# Patient Record
Sex: Male | Born: 1970 | Race: White | Hispanic: No | Marital: Married | Smoking: Former smoker
Health system: Southern US, Community
[De-identification: ages and names within clinical notes are randomized; demographics above are authoritative.]

## PROBLEM LIST (undated history)

## (undated) DIAGNOSIS — K219 Gastro-esophageal reflux disease without esophagitis: Secondary | ICD-10-CM

## (undated) DIAGNOSIS — M109 Gout, unspecified: Secondary | ICD-10-CM

## (undated) DIAGNOSIS — M069 Rheumatoid arthritis, unspecified: Secondary | ICD-10-CM

## (undated) DIAGNOSIS — Z8481 Family history of carrier of genetic disease: Secondary | ICD-10-CM

## (undated) DIAGNOSIS — N189 Chronic kidney disease, unspecified: Secondary | ICD-10-CM

## (undated) HISTORY — DX: Chronic kidney disease, unspecified: N18.9

## (undated) HISTORY — DX: Gout, unspecified: M10.9

## (undated) HISTORY — DX: Family history of carrier of genetic disease: Z84.81

## (undated) HISTORY — DX: Gastro-esophageal reflux disease without esophagitis: K21.9

---
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
## 2010-10-24 ENCOUNTER — Ambulatory Visit: Payer: Self-pay | Admitting: Family Medicine

## 2011-01-24 NOTE — Assessment & Plan Note (Signed)
Summary: NEW ACUTE LEFT FOOT PAIN/NJR   Vital Signs:  Patient profile:   40 year old male Height:      72 inches Weight:      226 pounds BMI:     30.76 Temp:     98.4 degrees F oral Pulse rate:   100 / minute Pulse rhythm:   regular Resp:     12 per minute BP sitting:   130 / 100  (left arm) Cuff size:   large  Vitals Entered By: Sid Falcon LPN (October 24, 2010 4:12 PM)   History of Present Illness: new patient visit.  Patient has known history of gout. One day history of acute left foot pain. No injury. Increased warmth and some mild redness. Previous flareups involving feet and elbow. Has used colchicine and prednisone with good relief in past. History of gastroesophageal reflux and takes Prilosec.  GERD  symptoms are well controlled.  Occasional alcohol use but no regular use. Smokes cigarettes. Family history unrevealing  Preventive Screening-Counseling & Management  Alcohol-Tobacco     Smoking Status: current     Packs/Day: 1.0     Year Started: 1988  Caffeine-Diet-Exercise     Does Patient Exercise: no  Allergies (verified): No Known Drug Allergies  Past History:  Social History: Last updated: 10/24/2010 Occupation:  Supervisor Married Current Smoker Alcohol use-yes Regular exercise-no  Risk Factors: Exercise: no (10/24/2010)  Risk Factors: Smoking Status: current (10/24/2010) Packs/Day: 1.0 (10/24/2010)  Past Medical History: Gout Acid reflux one kidney removed 1985  Past Surgical History: Nephrectomy PMH-FH-SH reviewed for relevance  Social History: Occupation:  Merchandiser, retail Married Current Smoker Alcohol use-yes Regular exercise-no Smoking Status:  current Packs/Day:  1.0 Occupation:  employed Does Patient Exercise:  no  Review of Systems  The patient denies fever, weight loss, abdominal pain, melena, hematochezia, and severe indigestion/heartburn.    Physical Exam  General:  Well-developed,well-nourished,in no acute  distress; alert,appropriate and cooperative throughout examination Mouth:  Oral mucosa and oropharynx without lesions or exudates.  Teeth in good repair. Neck:  No deformities, masses, or tenderness noted. Lungs:  Normal respiratory effort, chest expands symmetrically. Lungs are clear to auscultation, no crackles or wheezes. Heart:  Normal rate and regular rhythm. S1 and S2 normal without gallop, murmur, click, rub or other extra sounds. Extremities:  left foot reveals mild diffuse edema. He has some diffuse tenderness to palpation mostly around the metatarsophalangeal joint. Skin:  no rashes and no suspicious lesions.     Impression & Recommendations:  Problem # 1:  GOUT (ICD-274.9) Assessment Deteriorated pred taper and Colcrys one two times a day.  Discussed diet triggers.  He only has 1-2 episodes/yr and not interested in prophylaxis at this time. His updated medication list for this problem includes:    Colcrys 0.6 Mg Tabs (Colchicine) ..... One by mouth two times a day  Complete Medication List: 1)  Prilosec Otc 20 Mg Tbec (Omeprazole magnesium) .... Once daily 2)  Colcrys 0.6 Mg Tabs (Colchicine) .... One by mouth two times a day 3)  Prednisone 10 Mg Tabs (Prednisone) .... Taper as follows:  6-5-4-4-3-3-2-1 4)  Oxycodone Hcl 5 Mg Tabs (Oxycodone hcl) .Marland Kitchen.. 1-2 by mouth q 6 hours as needed pain  Patient Instructions: 1)  Touch base in 2-3 days if no significant improvement Prescriptions: OXYCODONE HCL 5 MG TABS (OXYCODONE HCL) 1-2 by mouth q 6 hours as needed pain  #15 x 0   Entered and Authorized by:   Evelena Peat MD  Signed by:   Evelena Peat MD on 10/24/2010   Method used:   Print then Give to Patient   RxID:   [id number] PREDNISONE 10 MG TABS (PREDNISONE) taper as follows:  6-5-4-4-3-3-2-1  #28 x 0   Entered and Authorized by:   Evelena Peat MD   Signed by:   Evelena Peat MD on 10/24/2010   Method used:   Print then Give to Patient   RxID:    [id number] COLCRYS 0.6 MG TABS (COLCHICINE) one by mouth two times a day  #60 x 5   Entered and Authorized by:   Evelena Peat MD   Signed by:   Evelena Peat MD on 10/24/2010   Method used:   Print then Give to Patient   RxID:   [id number]    Orders Added: 1)  New Patient Level II [01027]

## 2011-02-10 ENCOUNTER — Other Ambulatory Visit: Payer: Self-pay | Admitting: *Deleted

## 2011-02-10 NOTE — Telephone Encounter (Signed)
May refill prednisone for gout flare-as per prior taper.

## 2011-02-10 NOTE — Telephone Encounter (Signed)
Rx called in, documented in [facility] as pt has not been seen yet per Dr Caryl Never

## 2011-02-10 NOTE — Telephone Encounter (Signed)
Wants Oxycodone refill for gout

## 2011-03-21 ENCOUNTER — Other Ambulatory Visit: Payer: Self-pay | Admitting: Family Medicine

## 2011-03-23 ENCOUNTER — Other Ambulatory Visit: Payer: Self-pay | Admitting: *Deleted

## 2011-03-23 ENCOUNTER — Telehealth: Payer: Self-pay | Admitting: *Deleted

## 2011-03-23 MED ORDER — PREDNISONE 10 MG PO TABS: 10.0000 mg | ORAL_TABLET | Freq: Every day | ORAL | Status: DC

## 2011-03-23 NOTE — Telephone Encounter (Signed)
Corrected number on tabs called to pharmacy

## 2014-11-13 ENCOUNTER — Ambulatory Visit (INDEPENDENT_AMBULATORY_CARE_PROVIDER_SITE_OTHER): Payer: BC Managed Care – PPO | Admitting: Emergency Medicine

## 2014-11-13 VITALS — BP 124/78 | HR 85 | Temp 97.3°F | Resp 18 | Ht 73.0 in | Wt 227.8 lb

## 2014-11-13 DIAGNOSIS — M109 Gout, unspecified: Secondary | ICD-10-CM

## 2014-11-13 DIAGNOSIS — M10061 Idiopathic gout, right knee: Secondary | ICD-10-CM

## 2014-11-13 DIAGNOSIS — M25561 Pain in right knee: Secondary | ICD-10-CM

## 2014-11-13 LAB — POCT CBC
Granulocyte percent: 87.2 %G — AB (ref 37–80)
HCT, POC: 46.9 % (ref 43.5–53.7)
Hemoglobin: 15.5 g/dL (ref 14.1–18.1)
Lymph, poc: 0.9 (ref 0.6–3.4)
MCH, POC: 30.4 pg (ref 27–31.2)
MCHC: 33 g/dL (ref 31.8–35.4)
MCV: 92.2 fL (ref 80–97)
MID (cbc): 0.4 (ref 0–0.9)
MPV: 7.9 fL (ref 0–99.8)
POC Granulocyte: 8.9 — AB (ref 2–6.9)
POC LYMPH PERCENT: 9 %L — AB (ref 10–50)
POC MID %: 3.8 %M (ref 0–12)
Platelet Count, POC: 214 10*3/uL (ref 142–424)
RBC: 5.09 M/uL (ref 4.69–6.13)
RDW, POC: 13.3 %
WBC: 10.2 10*3/uL (ref 4.6–10.2)

## 2014-11-13 LAB — POCT SEDIMENTATION RATE: POCT SED RATE: 16 mm/hr (ref 0–22)

## 2014-11-13 MED ORDER — TRIAMCINOLONE ACETONIDE 40 MG/ML IJ SUSP
40.0000 mg | Freq: Once | INTRAMUSCULAR | Status: AC
  Administered 2014-11-13: 40 mg via INTRAMUSCULAR

## 2014-11-13 MED ORDER — HYDROCODONE-ACETAMINOPHEN 5-325 MG PO TABS: 1.0000 | ORAL_TABLET | ORAL | Status: DC | PRN

## 2014-11-13 MED ORDER — INDOMETHACIN 25 MG PO CAPS: 25.0000 mg | ORAL_CAPSULE | Freq: Three times a day (TID) | ORAL | Status: DC

## 2014-11-13 NOTE — Patient Instructions (Signed)

## 2014-11-13 NOTE — Progress Notes (Signed)
[facility] [address], [place] [address] [phone]  Date:  11/13/2014   Name:  Elijah King   DOB:  09/15/1971   MRN:  [id number]  PCP:  No PCP Per Patient    Chief Complaint: Gout   History of Present Illness:  Elijah King is a 43 y.o. very pleasant male patient who presents with the following:  History of gout.  Seen by [facility] Tuesday and put on colcrys and prednisone Has no improvement with treatment.   No history of injury or overuse No antecedent illness.  No fever or chills. Unable to bear weight on right leg. No improvement with over the counter medications or other home remedies.  Denies other complaint or health concern today.   There are no active problems to display for this patient.   Past Medical History  Diagnosis Date  . Chronic kidney disease     Past Surgical History  Procedure Laterality Date  . Kidney cyst removal      History  Substance Use Topics  . Smoking status: Current Every Day Smoker  . Smokeless tobacco: Not on file  . Alcohol Use: 0.0 oz/week    0 Not specified per week    History reviewed. No pertinent family history.  No Known Allergies  Medication list has been reviewed and updated.  No current outpatient prescriptions on file prior to visit.   No current facility-administered medications on file prior to visit.    Review of Systems:   As per HPI, otherwise negative.    Physical Examination: Filed Vitals:   11/13/14 1153  BP: 124/78  Pulse: 85  Temp: 97.3 F (36.3 C)  Resp: 18   Filed Vitals:   11/13/14 1153  Height: 6\' 1"  (1.854 m)  Weight: 227 lb 12.8 oz (103.329 kg)   Body mass index is 30.06 kg/(m^2). Ideal Body Weight: Weight in (lb) to have BMI = 25: 189.1   GEN: WDWN, NAD, Non-toxic, Alert & Oriented x 3 HEENT: Atraumatic, Normocephalic.  Ears and Nose: No external deformity. EXTR: No clubbing/cyanosis/edema NEURO: Normal gait.  PSYCH: Normally interactive.  Conversant. Not depressed or anxious appearing.  Calm demeanor.  RIGHT knee:  Warm and tender.  Guards, swollen  Assessment and Plan: Gout right knee Kenalog 40 Aspiration Labs pending  Signed,  Ellison Carwin, MD   Results for orders placed or performed in visit on 11/13/14  POCT CBC  Result Value Ref Range   WBC 10.2 4.6 - 10.2 K/uL   Lymph, poc 0.9 0.6 - 3.4   POC LYMPH PERCENT 9.0 (A) 10 - 50 %L   MID (cbc) 0.4 0 - 0.9   POC MID % 3.8 0 - 12 %M   POC Granulocyte 8.9 (A) 2 - 6.9   Granulocyte percent 87.2 (A) 37 - 80 %G   RBC 5.09 4.69 - 6.13 M/uL   Hemoglobin 15.5 14.1 - 18.1 g/dL   HCT, POC 46.9 43.5 - 53.7 %   MCV 92.2 80 - 97 fL   MCH, POC 30.4 27 - 31.2 pg   MCHC 33.0 31.8 - 35.4 g/dL   RDW, POC 13.3 %   Platelet Count, POC 214 142 - 424 K/uL   MPV 7.9 0 - 99.8 fL

## 2014-11-14 LAB — URIC ACID: Uric Acid, Serum: 8.8 mg/dL — ABNORMAL HIGH (ref 4.0–7.8)

## 2014-11-16 ENCOUNTER — Ambulatory Visit (INDEPENDENT_AMBULATORY_CARE_PROVIDER_SITE_OTHER): Payer: BC Managed Care – PPO | Admitting: Internal Medicine

## 2014-11-16 ENCOUNTER — Ambulatory Visit (INDEPENDENT_AMBULATORY_CARE_PROVIDER_SITE_OTHER): Payer: BC Managed Care – PPO

## 2014-11-16 VITALS — BP 142/82 | HR 101 | Temp 98.7°F | Resp 16 | Ht 73.0 in | Wt 227.0 lb

## 2014-11-16 DIAGNOSIS — M25561 Pain in right knee: Secondary | ICD-10-CM

## 2014-11-16 MED ORDER — PREDNISONE 20 MG PO TABS: ORAL_TABLET | ORAL | Status: DC

## 2014-11-16 MED ORDER — OXYCODONE-ACETAMINOPHEN 10-325 MG PO TABS: 1.0000 | ORAL_TABLET | Freq: Four times a day (QID) | ORAL | Status: DC | PRN

## 2014-11-16 NOTE — Progress Notes (Signed)
   Subjective:  This chart was scribed for [employer]. Laney Pastor, MD by Terressa Koyanagi, ED Scribe. This patient was seen in room 2 and the patient's care was started at 8:43 PM.     Patient ID: Elijah King, male    DOB: October 11, 1971, 43 y.o.   MRN: [id number]  Chief Complaint  Patient presents with  . Follow-up    Knee pain-not any better-Uric Acid was elevated    HPI PCP: No PCP Per Patient HPI Comments: Elijah King is a 43 y.o. male, with a Hx of gout, who presents to the [facility] complaining of acute, atraumatic, severe, ongoing, right knee pain. Pt reports that he has never experienced knee pain resulting from gout;  he generally gets pain in his toes due to gout. Started colchicine 1 per day and pred taper from PCP per phone 11/18. Then OV here Dr Ouida Sills 11/20 with dry tap but instillation kenalog and addition Indocin 25 tid. Pt reports that he has been taking everything as directed with little or no response. It is painful to ambulate and  "even the drive to the clinic today was difficult."   Numbness to Right Thigh: Pt also complains of a tingling, numbness and pins and needles sensation to his right thigh. Pt reports he works in [employer] and at [employer] most of the day. Intermittent 3 mos.  Denials: Pt denies fever, night sweats.    Past Medical History  Diagnosis Date  . Chronic kidney disease    Past Surgical History  Procedure Laterality Date  . Kidney cyst removal     Current Outpatient Prescriptions on File Prior to Visit  Medication Sig Dispense Refill  . Colchicine (COLCRYS PO) Take by mouth.    Marland Kitchen HYDROcodone-acetaminophen (NORCO) 5-325 MG per tablet Take 1-2 tablets by mouth every 4 (four) hours as needed. 30 tablet 0  . indomethacin (INDOCIN) 25 MG capsule Take 1 capsule (25 mg total) by mouth 3 (three) times daily with meals. 45 capsule 1   No current facility-administered medications on file prior to visit.     Review of  Systems  Constitutional: Negative for fever, chills and diaphoresis.  Musculoskeletal:       Right knee pain  Psychiatric/Behavioral: Negative for confusion.       Objective:   Physical Exam  Constitutional: He appears well-developed and well-nourished.  Musculoskeletal: He exhibits tenderness.  Right knee is puffy but not red and not very tender to palpation. Exquisitely tender with any ROM. No laxity.  No effusion. Maximal tenderness infrapatellar to light touch.  Skin: No rash noted.     [facility] reading (PRIMARY) by  Dr. Ramadan Couey=NAD.   Assessment & Plan:  Knee pain due to acute gouty arthrirtis  Restart pred at 80-0 over 8 days  incr colchicine to bid til resolved  Set up ortho consult  Chg to percocet for pain  In view of elevated uric acid with hx of some type of CKD he will need to f/u w/ his PCP to get on a strict diet with allopurinol    I personally performed the services described in this documentation, which was scribed in my presence. The recorded information has been reviewed and is accurate.

## 2014-11-16 NOTE — Patient Instructions (Addendum)
take colchicine twice a day til well then once a day for at least 1 month Take the new prednisone taper

## 2014-11-30 NOTE — Progress Notes (Signed)
Tried to call patient to see if anyone had scheduled his appt with Dr. Rip Harbour but his answering machine was full.

## 2015-03-04 ENCOUNTER — Ambulatory Visit (INDEPENDENT_AMBULATORY_CARE_PROVIDER_SITE_OTHER): Payer: 59 | Admitting: Family Medicine

## 2015-03-04 ENCOUNTER — Telehealth: Payer: Self-pay

## 2015-03-04 VITALS — BP 140/92 | HR 85 | Temp 99.0°F | Resp 18 | Ht 72.25 in | Wt 226.0 lb

## 2015-03-04 DIAGNOSIS — E669 Obesity, unspecified: Secondary | ICD-10-CM

## 2015-03-04 DIAGNOSIS — Z79899 Other long term (current) drug therapy: Secondary | ICD-10-CM | POA: Diagnosis not present

## 2015-03-04 DIAGNOSIS — IMO0001 Reserved for inherently not codable concepts without codable children: Secondary | ICD-10-CM

## 2015-03-04 DIAGNOSIS — M1009 Idiopathic gout, multiple sites: Secondary | ICD-10-CM

## 2015-03-04 DIAGNOSIS — R030 Elevated blood-pressure reading, without diagnosis of hypertension: Secondary | ICD-10-CM

## 2015-03-04 LAB — COMPREHENSIVE METABOLIC PANEL
ALT: 41 U/L (ref 0–53)
AST: 32 U/L (ref 0–37)
Albumin: 4.4 g/dL (ref 3.5–5.2)
Alkaline Phosphatase: 79 U/L (ref 39–117)
BUN: 17 mg/dL (ref 6–23)
CO2: 27 mEq/L (ref 19–32)
Calcium: 9.3 mg/dL (ref 8.4–10.5)
Chloride: 102 mEq/L (ref 96–112)
Creat: 1.39 mg/dL — ABNORMAL HIGH (ref 0.50–1.35)
Glucose, Bld: 112 mg/dL — ABNORMAL HIGH (ref 70–99)
Potassium: 5.2 mEq/L (ref 3.5–5.3)
Sodium: 137 mEq/L (ref 135–145)
Total Bilirubin: 0.3 mg/dL (ref 0.2–1.2)
Total Protein: 7.2 g/dL (ref 6.0–8.3)

## 2015-03-04 LAB — LIPID PANEL
Cholesterol: 186 mg/dL (ref 0–200)
HDL: 46 mg/dL (ref >=40)
LDL Cholesterol: 109 mg/dL — ABNORMAL HIGH (ref 0–99)
Total CHOL/HDL Ratio: 4 Ratio
Triglycerides: 156 mg/dL — ABNORMAL HIGH (ref <150)
VLDL: 31 mg/dL (ref 0–40)

## 2015-03-04 MED ORDER — PREDNISONE 20 MG PO TABS: ORAL_TABLET | ORAL | Status: DC

## 2015-03-04 MED ORDER — METHYLPREDNISOLONE SODIUM SUCC 125 MG IJ SOLR
125.0000 mg | Freq: Once | INTRAMUSCULAR | Status: AC
  Administered 2015-03-04: 125 mg via INTRAMUSCULAR

## 2015-03-04 MED ORDER — INDOMETHACIN 50 MG PO CAPS: 50.0000 mg | ORAL_CAPSULE | Freq: Three times a day (TID) | ORAL | Status: DC

## 2015-03-04 MED ORDER — OXYCODONE-ACETAMINOPHEN 10-325 MG PO TABS: 1.0000 | ORAL_TABLET | Freq: Four times a day (QID) | ORAL | Status: DC | PRN

## 2015-03-04 MED ORDER — COLCHICINE 0.6 MG PO TABS: ORAL_TABLET | ORAL | Status: DC

## 2015-03-04 MED ORDER — ALLOPURINOL 300 MG PO TABS: 300.0000 mg | ORAL_TABLET | Freq: Every day | ORAL | Status: DC

## 2015-03-04 NOTE — Telephone Encounter (Signed)
PA needed for colchicine. I called pharmacy to see if they could run Rx for both brand names: Colcrys tablets and Mitigare capsules. They said that none of the 3 are going through and each one that rejects says to try one of the others, which then rejects. Completed PA on covermymeds. Pending.

## 2015-03-04 NOTE — Progress Notes (Signed)
This chart was scribed for Delman Cheadle, MD by Einar Pheasant, ED Scribe. This patient was seen in room 2 and the patient's care was started at 8:20 AM.  Subjective:    Patient ID: Elijah King, male    DOB: [birth year], 44 y.o.   MRN: [id number]  Chief Complaint  Patient presents with  . Gout    pt has history of gout - not on any meds - pain 8/10  . Elbow Pain    Right elbow pain x 1 day  . Foot Pain    Right foot pain x 1 day    HPI Elijah King is a 44 y.o. male with PMhx of gout for 8-9 yrs presents to the office complaining of an exacerbation of his gout. Last November he presented for the same chief complaint with an elevated uric acid months prior during gout flare in right knee he has been treated with prednisone and colcry but with last flare he required and intraarticular Cortizone injection without significant benefit at his 3 day follow up. He was also place on Indocin and then second higher of parenteral Cortizone injection along with ortho referral, plus percocet. Looks like pt saw dr. Conan Bowens at [facility].   Today, pt comes in complaining of right elbow pain and right foot that have been going on for 1 day now. He states that he has never been placed on long term medication. Pt states that when he went to his ortho referral the provider just did some test but not alleviating treatments. He states that in the past the intraarticular Cortizone shot did not provide him adequate relief. Pt is out of the colcry. He states that he tried to make the necessary dietary changes but it hasn't helped much.   There are no active problems to display for this patient.  Past Medical History  Diagnosis Date  . Chronic kidney disease    Past Surgical History  Procedure Laterality Date  . Kidney cyst removal     No Known Allergies Prior to Admission medications   Medication Sig Start Date End Date Taking? Authorizing Provider  COLCRYS 0.6 MG tablet  11/11/14   Historical  Provider, MD  indomethacin (INDOCIN) 25 MG capsule Take 1 capsule (25 mg total) by mouth 3 (three) times daily with meals. Patient not taking: Reported on 03/04/2015 11/13/14   Roselee Culver, MD  metroNIDAZOLE (FLAGYL) 500 MG tablet  11/10/14   Historical Provider, MD  oxyCODONE-acetaminophen (PERCOCET) 10-325 MG per tablet Take 1 tablet by mouth every 6 (six) hours as needed for pain. Patient not taking: Reported on 03/04/2015 11/16/14   Leandrew Koyanagi, MD  predniSONE (DELTASONE) 20 MG tablet 4/4/3/3/2/2/1/1 single daily dose for 8 days Patient not taking: Reported on 03/04/2015 11/16/14   Leandrew Koyanagi, MD   History   Social History  . Marital Status: Married    Spouse Name: N/A  . Number of Children: N/A  . Years of Education: N/A   Occupational History  . Not on file.   Social History Main Topics  . Smoking status: Current Every Day Smoker -- 0.25 packs/day    Types: Cigarettes    Start date: 03/03/1989  . Smokeless tobacco: Never Used  . Alcohol Use: 0.0 oz/week    0 Standard drinks or equivalent per week  . Drug Use: No  . Sexual Activity: Not on file   Other Topics Concern  . Not on file   Social History Narrative  Review of Systems  Constitutional: Negative for fever and chills.  HENT: Negative for congestion and sore throat.   Eyes: Negative for visual disturbance.  Respiratory: Negative for cough and shortness of breath.   Cardiovascular: Negative for chest pain and leg swelling.  Gastrointestinal: Negative for nausea, vomiting, abdominal pain and diarrhea.  Genitourinary: Negative for dysuria.  Musculoskeletal: Positive for joint swelling and arthralgias. Negative for back pain and neck pain.  Skin: Negative for rash.  Neurological: Negative for headaches.  Hematological: Does not bruise/bleed easily.  Psychiatric/Behavioral: Negative for confusion.     BP 140/92 mmHg  Pulse 85  Temp(Src) 99 F (37.2 C) (Oral)  Resp 18  Ht 6' 0.25" (1.835  m)  Wt 226 lb (102.513 kg)  BMI 30.44 kg/m2  SpO2 98%  Objective:   Physical Exam  Constitutional: He is oriented to person, place, and time. He appears well-developed and well-nourished. No distress.  HENT:  Head: Normocephalic and atraumatic.  Eyes: Conjunctivae and EOM are normal.  Neck: Neck supple.  Cardiovascular: Normal rate.   Pulses:      Dorsalis pedis pulses are 2+ on the left side.  Pulmonary/Chest: Effort normal. No respiratory distress.  Musculoskeletal: Normal range of motion. He exhibits edema and tenderness.  Right Foot: Mild erytema and warm especially over distal aspect of right foot. Minimal active ROM secondary to pain.  2+ pedal edema. Blanching rash. Pain mainly localized over MTP.  Right Elbow: very mild warmth of the distal olecranon with 1+ effusion of the olecranon bursa. Minimal restriction in ROM secondary to pain and swelling  Neurological: He is alert and oriented to person, place, and time.  Skin: Skin is warm and dry.  Psychiatric: He has a normal mood and affect. His behavior is normal.  Nursing note and vitals reviewed.  Assessment & Plan:   8:36 AM- Advised pt to see a rheumatologist in the near future and to also wear comfortable shoes when the flare resolves. Recommended that pt return in 3-4 weeks to recheck his uric acid level to ensure allopurinol dose is effective.  Acute idiopathic gout of multiple sites - Plan: Comprehensive metabolic panel, methylPREDNISolone sodium succinate (SOLU-MEDROL) 125 mg/2 mL injection 125 mg - cannot afford colcrys so start prednisone taper then transition to indocin - advised pt to keep indocin at home and restart ticdd immediately when gout flairs to hopefully abort.  Start allopurinol for flair prevention due to freq severe attacks recently.  Reviewed recommended diet changes.  Polypharmacy - Plan: methylPREDNISolone sodium succinate (SOLU-MEDROL) 125 mg/2 mL injection 125 mg - No renal function labs seen in  pt's chart so will obtain them today before starting pt on medication. Pt agrees to the proposed course of treatment.   Elevated blood pressure - Plan: Lipid panel  Obesity, Class I, BMI 30.0-34.9 (see actual BMI) - Plan: Lipid panel  Meds ordered this encounter  Medications  . methylPREDNISolone sodium succinate (SOLU-MEDROL) 125 mg/2 mL injection 125 mg    Sig:   . predniSONE (DELTASONE) 20 MG tablet    Sig: 4/4/3/3/2/2/1/1 single daily dose for 8 days    Dispense:  20 tablet    Refill:  0  . oxyCODONE-acetaminophen (PERCOCET) 10-325 MG per tablet    Sig: Take 1 tablet by mouth every 6 (six) hours as needed for pain.    Dispense:  30 tablet    Refill:  0  . indomethacin (INDOCIN) 50 MG capsule    Sig: Take 1 capsule (50 mg total)  by mouth 3 (three) times daily with meals.    Dispense:  60 capsule    Refill:  1  . colchicine (COLCRYS) 0.6 MG tablet    Sig: Take 2 tabs po x 1 then 1 tab 1 hour later. Take immed at onset of gout flair. Repeat daily until diarrhea or flair resolving    Dispense:  30 tablet    Refill:  1  . allopurinol (ZYLOPRIM) 300 MG tablet    Sig: Take 1 tablet (300 mg total) by mouth daily.    Dispense:  90 tablet    Refill:  0    I personally performed the services described in this documentation, which was scribed in my presence. The recorded information has been reviewed and considered, and addended by me as needed.  Delman Cheadle, MD MPH

## 2015-03-04 NOTE — Patient Instructions (Signed)
Come back in 4 weeks so we can check your labs and make sure your medication is at the right dose for gout prevention.  Start the oral prednisone tomorrow morning so it doesn't keep you up today. Do not take the indocin while you are on the prednisone - ok to overlap maybe at the very lowest dose of prednisone but will likely cause more stomach upset without significant reduction in pain. Take the indocin and prednisone with food. Start the colcrys today with as needed oxycodone.   You can reduce your uric acid level by avoiding red meat, saturated fats (dairy fats, butter fats, animal fats), high fructose corn syrup, and beer and drinking more water.  There are some easy diet adjustments that you can make to lower your blood uric acid level and thereby hopefully never have to suffer from another gout flair (or at least less frequently).  You should avoid alcohol, drink plenty of water, and try to follow a "low purine" diet as your body produces uric acid when it breaks down prurines-substances that are found naturally in your body, as well as in certain foods such as organ meats, anchioves, herring, asparagus, and mushrooms. Also, increasing your diet in certain foods that may lower uric acid levels is a pretty safe way to decrease your likelihood of gout so consider drinking coffee (regular and/or decaf), eating fruits with Vitamin C in them such as citrus fruits, strawberries, broccoli,  brussel sprouts, papaya, and cantaloupe (though megadoses of vitamin C supplements may do the opposite and increase your body's uric acid levels), and/or eating more cherries and other dark-colored fruits, such as blackberries, blueberries, purple grapes and raspberries. In addition, getting plenty of vitamin A though yellow fruits, or dark green/yellow vegetables at least every other day is good.  Other general diet guidelines for people with gout who need to lower their blood uric acid levels are as follows:    Drink 8 to 16 cups ( about 2 to 4 liters) of fluid each day, with at least half being water. Eat a moderate amount of protein, preferably from healthy sources, such as low-fat or fat-free dairy, tofu, eggs, and nut butters. Limit your daily intake of meat, fish, and poultry to 4 to 6 ounces. Avoid high fat meats and desserts. Decrease your intake of shellfish, beef, lamb, pork, eggs and cheese. Avoid drastic weight reduction or fasting.  If weight loss is desired lose it over a period of several months.   Information for patients with Gout  Gout defined-Gout occurs when urate crystals accumulate in your joint causing the inflammation and intense pain of gout attack.  Urate crystals can form when you have high levels of uric acid in your blood.  Your body produces uric acid when it breaks down prurines-substances that are found naturally in your body, as well as in certain foods such as organ meats, anchioves, herring, asparagus, and mushrooms.  Normally uric acid dissolves in your blood and passes through your kidneys into your urine.  But sometimes your body either produces too much uric acid or your kidneys excrete too little uric acid.  When this happens, uric acid can build up, forming sharp needle-like urate crystals in a joint or surrounding tissue that cause pain, inflammation and swelling.    Gout is characterized by sudden, severe attacks of pain, redness and tenderness in joints, often the joint at the base of the big toe.  Gout is complex form of arthritis that can affect anyone.  Men are more likely to get gout but women become increasingly more susceptible to gout after menopause.  An acute attack of gout can wake you up in the middle of the night with the sensation that your big toe is on fire.  The affected joint is hot, swollen and so tender that even the weight or the sheet on it may seem intolerable.  If you experience symptoms of an acute gout attack it is important to your  doctor as soon as the symptoms start.  Gout that goes untreated can lead to worsening pain and joint damage.  Risk Factors:  You are more likely to develop gout if you have high levels of uric acid in your body.    Factors that increase the uric acid level in your body include:  Lifestyle factors.  Excessive alcohol use-generally more than two drinks a day for men and more than one for women increase the risk of gout.  Medical conditions.  Certain conditions make it more likely that you will develop gout.  These include hypertension, and chronic conditions such as diabetes, high levels of fat and cholesterol in the blood, and narrowing of the arteries.  Certain medications.  The uses of Thiazide diuretics- commonly used to treat hypertension and low dose aspirin can also increase uric acid levels.  Family history of gout.  If other members of your family have had gout, you are more likely to develop the disease.  Age and sex. Gout occurs more often in men than it does in women, primarily because women tend to have lower uric acid levels than men do.  Men are more likely to develop gout earlier usually between the ages of 23-50- whereas women generally develop signs and symptoms after menopause.    Tests and diagnosis:  Tests to help diagnose gout may include:  Blood test.  Your doctor may recommend a blood test to measure the uric acid level in your blood .  Blood tests can be misleading, though.  Some people have high uric acid levels but never experience gout.  And some people have signs and symptoms of gout, but don't have unusual levels of uric acid in their blood.  Joint fluid test.  Your doctor may use a needle to draw fluid from your affected joint.  When examined under the microscope, your joint fluid may reveal urate crystals.  Treatment:  Treatment for gout usually involves medications.  What medications you and your doctor choose will be based on your current health and other  medications you currently take.  Gout medications can be used to treat acute gout attacks and prevent future attacks as well as reduce your risk of complications from gout such as the development of tophi from urate crystal deposits.  Alternative medicine:   Certain foods have been studied for their potential to lower uric acid levels, including:  Coffee.  Studies have found an association between coffee drinking (regular and decaf) and lower uric acid levels.  The evidence is not enough to encourage non-coffee drinkers to start, but it may give clues to new ways of treating gout in the future.  Vitamin C.  Supplements containing vitamin C may reduce the levels of uric acid in your blood.  However, vitamin as a treatment for gout. Don't assume that if a little vitamin C is good, than lots is better.  Megadoses of vitamin C may increase your bodies uric acid levels.  Cherries.  Cherries have been associated with lower levels of  uric acid in studies, but it isn't clear if they have any effect on gout signs and symptoms.  Eating more cherries and other dar-colored fruits, such as blackberries, blueberries, purple grapes and raspberries, may be a safe way to support your gout treatment.    Lifestyle/Diet Recommendations:   Drink 8 to 16 cups ( about 2 to 4 liters) of fluid each day, with at least half being water.  Avoid alcohol  Eat a moderate amount of protein, preferably from healthy sources, such as low-fat or fat-free dairy, tofu, eggs, and nut butters.  Limit you daily intake of meat, fish, and poultry to 4 to 6 ounces.  Avoid high fat meats and desserts.  Decrease you intake of shellfish, beef, lamb, pork, eggs and cheese.  Choose a good source of vitamin C daily such as citrus fruits, strawberries, broccoli,  brussel sprouts, papaya, and cantaloupe.   Choose a good source of vitamin A every other day such as yellow fruits, or dark green/yellow vegetables.  Avoid drastic weigh  reduction or fasting.  If weigh loss is desired lose it over a period of several months.  See "dietary considerations.." chart for specific food recommendations.  Dietary Considerations for people with Gout  Food with negligible purine content (0-15 mg of purine nitrogen per 100 grams food)  May use as desired except on calorie variations  Non fat milk Cocoa Cereals (except in list II) Hard candies  Buttermilk Carbonated drinks Vegetables (except in list II) Sherbet  Coffee Fruits Sugar Honey  Tea Cottage Cheese Gelatin-jell-o Salt  Fruit juice Breads Angel food Cake   Herbs/spices Jams/Jellies [employer]    Foods that do not contain excessive purine content, but must be limited due to fat content  Cream Eggs Oil and Salad Dressing  Half and Half Peanut Butter Chocolate  Whole Milk Cakes Potato Chips  Butter Ice Cream Fried Foods  Cheese Nuts Waffles, pancakes   List II: Food with moderate purine content (50-150 mg of purine nitrogen per 100 grams of food)  Limit total amount each day to 5 oz. cooked Lean meat, other than those on list III   Poultry, other than those on list III Fish, other than those on list III   Seafood, other than those on list III  These foods may be used occasionally  Peas Lentils Bran  Spinach Oatmeal Dried Beans and Peas  Asparagus Wheat Germ Mushrooms   Additional information about meat choices  Choose fish and poultry, particularly without skin, often.  Select lean, well trimmed cuts of meat.  Avoid all fatty meats, bacon , sausage, fried meats, fried fish, or poultry, luncheon meats, cold cuts, hot dogs, meats canned or frozen in gravy, spareribs and frozen and packaged prepared meats.   List III: Foods with HIGH purine content / Foods to AVOID (150-800 mg of purine nitrogen per 100 grams of food)  Anchovies Herring Meat Broths  Liver Mackerel Meat Extracts  Kidney Scallops Meat Drippings  Sardines Wild Game Mincemeat    Sweetbreads Goose Gravy  Heart Tongue Yeast, baker's and brewers   Commercial soups made with any of the foods listed in List II or List III  In addition avoid all alcoholic beverages  Gout Gout is an inflammatory arthritis caused by a buildup of uric acid crystals in the joints. Uric acid is a chemical that is normally present in the blood. When the level of uric acid in the blood is too high it can form crystals that  deposit in your joints and tissues. This causes joint redness, soreness, and swelling (inflammation). Repeat attacks are common. Over time, uric acid crystals can form into masses (tophi) near a joint, destroying bone and causing disfigurement. Gout is treatable and often preventable. CAUSES  The disease begins with elevated levels of uric acid in the blood. Uric acid is produced by your body when it breaks down a naturally found substance called purines. Certain foods you eat, such as meats and fish, contain high amounts of purines. Causes of an elevated uric acid level include:  Being passed down from parent to child (heredity).  Diseases that cause increased uric acid production (such as obesity, psoriasis, and certain cancers).  Excessive alcohol use.  Diet, especially diets rich in meat and seafood.  Medicines, including certain cancer-fighting medicines (chemotherapy), water pills (diuretics), and aspirin.  Chronic kidney disease. The kidneys are no longer able to remove uric acid well.  Problems with metabolism. Conditions strongly associated with gout include:  Obesity.  High blood pressure.  High cholesterol.  Diabetes. Not everyone with elevated uric acid levels gets gout. It is not understood why some people get gout and others do not. Surgery, joint injury, and eating too much of certain foods are some of the factors that can lead to gout attacks. SYMPTOMS   An attack of gout comes on quickly. It causes intense pain with redness, swelling, and warmth  in a joint.  Fever can occur.  Often, only one joint is involved. Certain joints are more commonly involved:  Base of the big toe.  Knee.  Ankle.  Wrist.  Finger. Without treatment, an attack usually goes away in a few days to weeks. Between attacks, you usually will not have symptoms, which is different from many other forms of arthritis. DIAGNOSIS  Your caregiver will suspect gout based on your symptoms and exam. In some cases, tests may be recommended. The tests may include:  Blood tests.  Urine tests.  X-rays.  Joint fluid exam. This exam requires a needle to remove fluid from the joint (arthrocentesis). Using a microscope, gout is confirmed when uric acid crystals are seen in the joint fluid. TREATMENT  There are two phases to gout treatment: treating the sudden onset (acute) attack and preventing attacks (prophylaxis).  Treatment of an Acute Attack.  Medicines are used. These include anti-inflammatory medicines or steroid medicines.  An injection of steroid medicine into the affected joint is sometimes necessary.  The painful joint is rested. Movement can worsen the arthritis.  You may use warm or cold treatments on painful joints, depending which works best for you.  Treatment to Prevent Attacks.  If you suffer from frequent gout attacks, your caregiver may advise preventive medicine. These medicines are started after the acute attack subsides. These medicines either help your kidneys eliminate uric acid from your body or decrease your uric acid production. You may need to stay on these medicines for a very long time.  The early phase of treatment with preventive medicine can be associated with an increase in acute gout attacks. For this reason, during the first few months of treatment, your caregiver may also advise you to take medicines usually used for acute gout treatment. Be sure you understand your caregiver's directions. Your caregiver may make several  adjustments to your medicine dose before these medicines are effective.  Discuss dietary treatment with your caregiver or dietitian. Alcohol and drinks high in sugar and fructose and foods such as meat, poultry, and seafood can  increase uric acid levels. Your caregiver or dietitian can advise you on drinks and foods that should be limited. HOME CARE INSTRUCTIONS   Do not take aspirin to relieve pain. This raises uric acid levels.  Only take over-the-counter or prescription medicines for pain, discomfort, or fever as directed by your caregiver.  Rest the joint as much as possible. When in bed, keep sheets and blankets off painful areas.  Keep the affected joint raised (elevated).  Apply warm or cold treatments to painful joints. Use of warm or cold treatments depends on which works best for you.  Use crutches if the painful joint is in your leg.  Drink enough fluids to keep your urine clear or pale yellow. This helps your body get rid of uric acid. Limit alcohol, sugary drinks, and fructose drinks.  Follow your dietary instructions. Pay careful attention to the amount of protein you eat. Your daily diet should emphasize fruits, vegetables, whole grains, and fat-free or low-fat milk products. Discuss the use of coffee, vitamin C, and cherries with your caregiver or dietitian. These may be helpful in lowering uric acid levels.  Maintain a healthy body weight. SEEK MEDICAL CARE IF:   You develop diarrhea, vomiting, or any side effects from medicines.  You do not feel better in 24 hours, or you are getting worse. SEEK IMMEDIATE MEDICAL CARE IF:   Your joint becomes suddenly more tender, and you have chills or a fever. MAKE SURE YOU:   Understand these instructions.  Will watch your condition.  Will get help right away if you are not doing well or get worse. Document Released: 12/08/2000 Document Revised: 04/27/2014 Document Reviewed: 07/24/2012 [facility] Patient Information 2015  [place], [place]. This information is not intended to replace advice given to you by your health care provider. Make sure you discuss any questions you have with your health care provider.

## 2015-03-10 NOTE — Telephone Encounter (Signed)
PA was denied - reason: can not perform PA because colchicine is a plan exclusion, will not cover. Dr Brigitte Pulse, do you want to change plan for treatment, or advise that pt pay OOP?

## 2015-03-11 NOTE — Telephone Encounter (Signed)
To bad.  He was put on prednisone and has indocin - so if he gets recurrence of gout flair start tid indocin and drink tons of water. If it gets worse, will need to be seen for course of prednisone.  Pt was started on allopurinol daily so hopefully will be having less flairs - should come in to have his uric acid level rechecked to ensure allopurinol dose is sufficient - if she is doing well and wants to have a lab only visit for that that is fine and can enter order linked to gout.

## 2015-03-12 ENCOUNTER — Other Ambulatory Visit: Payer: Self-pay

## 2015-03-12 DIAGNOSIS — M109 Gout, unspecified: Secondary | ICD-10-CM

## 2015-03-12 NOTE — Telephone Encounter (Signed)
Spoke to pt. He is doing much better. Gave him your instructions. He is going to come in for a lab only visit. I will go ahead and put a future order in.

## 2015-03-18 ENCOUNTER — Encounter: Payer: Self-pay | Admitting: Family Medicine

## 2015-03-18 ENCOUNTER — Other Ambulatory Visit: Payer: Self-pay | Admitting: Family Medicine

## 2015-03-18 DIAGNOSIS — Z79899 Other long term (current) drug therapy: Secondary | ICD-10-CM

## 2015-04-09 ENCOUNTER — Ambulatory Visit: Payer: 59 | Admitting: Family Medicine

## 2015-05-27 ENCOUNTER — Ambulatory Visit (INDEPENDENT_AMBULATORY_CARE_PROVIDER_SITE_OTHER): Payer: 59 | Admitting: Family Medicine

## 2015-05-27 VITALS — BP 130/90 | HR 86 | Temp 98.8°F | Ht 72.0 in | Wt 225.0 lb

## 2015-05-27 DIAGNOSIS — M109 Gout, unspecified: Secondary | ICD-10-CM | POA: Insufficient documentation

## 2015-05-27 DIAGNOSIS — M1000 Idiopathic gout, unspecified site: Secondary | ICD-10-CM | POA: Insufficient documentation

## 2015-05-27 DIAGNOSIS — M10021 Idiopathic gout, right elbow: Secondary | ICD-10-CM

## 2015-05-27 DIAGNOSIS — K219 Gastro-esophageal reflux disease without esophagitis: Secondary | ICD-10-CM

## 2015-05-27 MED ORDER — METHYLPREDNISOLONE SODIUM SUCC 125 MG IJ SOLR: 125.0000 mg | Freq: Once | INTRAMUSCULAR | Status: DC

## 2015-05-27 MED ORDER — METHYLPREDNISOLONE SODIUM SUCC 125 MG IJ SOLR
125.0000 mg | Freq: Once | INTRAMUSCULAR | Status: AC
Start: 2015-05-27 — End: 2015-05-27
  Administered 2015-05-27: 125 mg via INTRAMUSCULAR

## 2015-05-27 MED ORDER — PREDNISONE 20 MG PO TABS
20.0000 mg | ORAL_TABLET | Freq: Every day | ORAL | Status: DC
Start: 2015-05-27 — End: 2016-05-13

## 2015-05-27 MED ORDER — RANITIDINE HCL 150 MG PO TABS: 150.0000 mg | ORAL_TABLET | Freq: Two times a day (BID) | ORAL | Status: DC

## 2015-05-27 NOTE — Patient Instructions (Signed)
We have given you a steroid shot today.  Please start the oral steroids tomorrow, take 2 pills daily for 5 days.  It is important to start taking the allopurinol every day, not just occasionally. This should help to prevent these flares. You need to start taking your indocin three times per day daily until a few days after this flare has passed. Please take the zantac twice daily for 2-3 weeks while you're on these medications to help protect your stomach.  We are checking your uric acid today.

## 2015-05-27 NOTE — Progress Notes (Signed)
   Subjective:    Patient ID: Elijah King, male    DOB: April 04, 1971, 44 y.o.   MRN: [id number]  Chief Complaint  Patient presents with  . Gout    right elbow-started this morning (swollen & painful)   Patient Active Problem List   Diagnosis Date Noted  . 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  HPI  74 yom with pmh recurrent gout attacks presents with right elbow pain and concern for gout.  Sx started upon waking this am. Severe pain, swelling, redness right elbow. Similar to prior episodes. Denies trauma. Denies fevers, chills. States he ate a lot of pork the weekend prior.   Hx recurrent gout. Numerous attacks over the years. Has had gout in elbow several times previously. On allopurinol daily but has only been taking 2-3 times week as he forgets the med. Most recent uric acid was 8.8 from 6 months ago. He was last seen by [place] 3 months with an acute gout attack and was treated and instructed to follow with rheum for recurrent attacks but today states he never got around to seeing them.  He does have a few norco at home, also has indomethacin at home but has not taken for this attack.   Review of Systems See HPI.     Objective:   Physical Exam  Constitutional: He is oriented to person, place, and time. He appears well-developed and well-nourished.  Non-toxic appearance. He does not have a sickly appearance. He does not appear ill. No distress.  BP 130/90 mmHg  Pulse 86  Temp(Src) 98.8 F (37.1 C) (Oral)  Ht 6' (1.829 m)  Wt 225 lb (102.059 kg)  BMI 30.51 kg/m2  SpO2 99%   Musculoskeletal:  Swollen, warm, exquisitely tender right elbow. No erythema. Decreased rom due to pain.   Neurological: He is alert and oriented to person, place, and time.      Assessment & Plan:   43 yom with pmh recurrent gout attacks presents with right elbow pain and  concern for gout.  Acute idiopathic gout of right elbow - Plan: Uric Acid, predniSONE (DELTASONE) 20 MG tablet, methylPREDNISolone sodium succinate (SOLU-MEDROL) 125 mg/2 mL injection 125 mg, DISCONTINUED: methylPREDNISolone sodium succinate (SOLU-MEDROL) 125 mg/2 mL injection 125 mg --most likely acute gout attack right elbow  --solumedrol in clinic, 5 day oral pred burst starting tomorrow, start indomethacin tid --> continue for 2-3 past resolution of sx --instructed pt needs to resume daily allopurinol once this flare has passed --checking serum uric acid today --sent in for zantac bid while on nsaids and steroids, pt stated he actually takes prilosec prn and has at home, instructed to take this qd for next several wks for gi prophylaxis  Julieta Gutting, PA-C Physician Assistant-Certified [facility]  05/27/2015 11:21 PM

## 2015-05-28 LAB — URIC ACID: Uric Acid, Serum: 6.6 mg/dL (ref 4.0–7.8)

## 2015-05-29 ENCOUNTER — Telehealth: Payer: Self-pay | Admitting: Radiology

## 2015-05-29 NOTE — Telephone Encounter (Signed)
Pt needs allopurinol.

## 2015-05-31 MED ORDER — ALLOPURINOL 300 MG PO TABS: 300.0000 mg | ORAL_TABLET | Freq: Every day | ORAL | Status: DC

## 2015-05-31 NOTE — Telephone Encounter (Signed)
Refill escribed

## 2015-06-08 NOTE — Progress Notes (Signed)
History and physical examinations reviewed in detail with PA McVeigh. Agree with Assessment and Plan. Seham Gardenhire Elayne Guerin, M.D. [facility] [address] [place], [place]  [address] [phone] phone [phone] fax

## 2015-11-28 ENCOUNTER — Ambulatory Visit (INDEPENDENT_AMBULATORY_CARE_PROVIDER_SITE_OTHER): Payer: 59 | Admitting: Emergency Medicine

## 2015-11-28 VITALS — BP 122/88 | HR 101 | Temp 98.6°F | Resp 18 | Ht 72.0 in | Wt 217.0 lb

## 2015-11-28 DIAGNOSIS — M1009 Idiopathic gout, multiple sites: Secondary | ICD-10-CM

## 2015-11-28 DIAGNOSIS — M10021 Idiopathic gout, right elbow: Secondary | ICD-10-CM

## 2015-11-28 MED ORDER — COLCHICINE 0.6 MG PO TABS
ORAL_TABLET | ORAL | Status: DC
Start: 2015-11-28 — End: 2016-08-26

## 2015-11-28 MED ORDER — HYDROCODONE-ACETAMINOPHEN 5-325 MG PO TABS: 1.0000 | ORAL_TABLET | ORAL | Status: DC | PRN

## 2015-11-28 MED ORDER — PREDNISONE 10 MG (48) PO TBPK: ORAL_TABLET | Freq: Every day | ORAL | Status: DC

## 2015-11-28 NOTE — Patient Instructions (Signed)

## 2015-11-28 NOTE — Progress Notes (Signed)
Subjective:  Patient ID: Elijah King, male    DOB: [birth year]  Age: 44 y.o. MRN: [id number]  CC: Gout; Elbow Pain; and Foot Pain   HPI Elijah King presents    He has a history of gout with numerous recurrences been taking allopurinol and indomethacin every day. He now has an acute exacerbation of his gout involving the right elbow and left foot. He history the point where he can barely extend his elbow. He has swelling and erythema and marked pain. He denies any injury fever chills or other complaints  History Elijah King has a past medical history of Chronic kidney disease and Gout.   He has past surgical history that includes Kidney cyst removal.   His  family history is not on file.  He   reports that he has been smoking Cigarettes.  He started smoking about 26 years ago. He has been smoking about 0.25 packs per day. He has never used smokeless tobacco. He reports that he drinks alcohol. He reports that he does not use illicit drugs.  Outpatient Prescriptions Prior to Visit  Medication Sig Dispense Refill  . allopurinol (ZYLOPRIM) 300 MG tablet Take 1 tablet (300 mg total) by mouth daily. 90 tablet 3  . indomethacin (INDOCIN) 50 MG capsule Take 1 capsule (50 mg total) by mouth 3 (three) times daily with meals. 60 capsule 1  . ranitidine (ZANTAC) 150 MG tablet Take 1 tablet (150 mg total) by mouth 2 (two) times daily. 30 tablet 0  . predniSONE (DELTASONE) 20 MG tablet Take 1 tablet (20 mg total) by mouth daily with breakfast. (Patient not taking: Reported on 11/28/2015) 10 tablet 0  . colchicine (COLCRYS) 0.6 MG tablet Take 2 tabs po x 1 then 1 tab 1 hour later. Take immed at onset of gout flair. Repeat daily until diarrhea or flair resolving (Patient not taking: Reported on 05/27/2015) 30 tablet 1   No facility-administered medications prior to visit.    Social History   Social History  . Marital Status: Married    Spouse Name: N/A  . Number of Children: N/A  . Years of  Education: N/A   Social History Main Topics  . Smoking status: Current Every Day Smoker -- 0.25 packs/day    Types: Cigarettes    Start date: 03/03/1989  . Smokeless tobacco: Never Used  . Alcohol Use: 0.0 oz/week    0 Standard drinks or equivalent per week  . Drug Use: No  . Sexual Activity: Not Asked   Other Topics Concern  . None   Social History Narrative     Review of Systems  Constitutional: Negative for fever, chills and appetite change.  HENT: Negative for congestion, ear pain, postnasal drip, sinus pressure and sore throat.   Eyes: Negative for pain and redness.  Respiratory: Negative for cough, shortness of breath and wheezing.   Cardiovascular: Negative for leg swelling.  Gastrointestinal: Negative for nausea, vomiting, abdominal pain, diarrhea, constipation and blood in stool.  Endocrine: Negative for polyuria.  Genitourinary: Negative for dysuria, urgency, frequency and flank pain.  Musculoskeletal: Positive for arthralgias. Negative for gait problem.  Skin: Negative for rash.  Neurological: Negative for weakness and headaches.  Psychiatric/Behavioral: Negative for confusion and decreased concentration. The patient is not nervous/anxious.     Objective:  BP 122/88 mmHg  Pulse 101  Temp(Src) 98.6 F (37 C) (Oral)  Resp 18  Ht 6' (1.829 m)  Wt 217 lb (98.431 kg)  BMI 29.42 kg/m2  SpO2  98%  Physical Exam    Assessment & Plan:   Rehman was seen today for gout, elbow pain and foot pain.  Diagnoses and all orders for this visit:  Acute idiopathic gout of right elbow  Acute idiopathic gout of multiple sites  Other orders -     colchicine (COLCRYS) 0.6 MG tablet; Take 2 tabs po x 1 then 1 tab 1 hour later. Take immed at onset of gout flair. Repeat daily until diarrhea or flair resolving -     HYDROcodone-acetaminophen (NORCO) 5-325 MG tablet; Take 1-2 tablets by mouth every 4 (four) hours as needed. -     predniSONE (STERAPRED UNI-PAK 48 TAB) 10 MG  (48) TBPK tablet; Take by mouth daily. Take as directed on package  I am having [name] start on HYDROcodone-acetaminophen and predniSONE. I am also having him maintain his indomethacin, predniSONE, ranitidine, allopurinol, and colchicine.  Meds ordered this encounter  Medications  . colchicine (COLCRYS) 0.6 MG tablet    Sig: Take 2 tabs po x 1 then 1 tab 1 hour later. Take immed at onset of gout flair. Repeat daily until diarrhea or flair resolving    Dispense:  30 tablet    Refill:  1  . HYDROcodone-acetaminophen (NORCO) 5-325 MG tablet    Sig: Take 1-2 tablets by mouth every 4 (four) hours as needed.    Dispense:  30 tablet    Refill:  0  . predniSONE (STERAPRED UNI-PAK 48 TAB) 10 MG (48) TBPK tablet    Sig: Take by mouth daily. Take as directed on package    Dispense:  48 tablet    Refill:  0    Appropriate red flag conditions were discussed with the patient as well as actions that should be taken.  Patient expressed his understanding.  Follow-up: Return if symptoms worsen or fail to improve.  Roselee Culver, MD

## 2015-12-02 ENCOUNTER — Telehealth: Payer: Self-pay

## 2015-12-02 NOTE — Telephone Encounter (Signed)
PA notice again for colchicine. See notes under PA done in March. Called pharm to see if they tried to run brand Colcrys and brand Mitigare. Pharm stated that she did, but tried again. Once again [facility] denied saying use Mitigare, Mitigare denied saying use lower cost alternative Colcrys, and then after Colcrys denied again, she tried the [place] one more time and it went through for $34. She doesn't understand it either, but will call pt to advise covered and ready.

## 2016-05-13 ENCOUNTER — Ambulatory Visit (INDEPENDENT_AMBULATORY_CARE_PROVIDER_SITE_OTHER): Payer: 59 | Admitting: Physician Assistant

## 2016-05-13 VITALS — BP 130/90 | HR 95 | Temp 98.3°F | Resp 16 | Ht 73.0 in | Wt 213.0 lb

## 2016-05-13 DIAGNOSIS — R748 Abnormal levels of other serum enzymes: Secondary | ICD-10-CM | POA: Diagnosis not present

## 2016-05-13 DIAGNOSIS — M109 Gout, unspecified: Secondary | ICD-10-CM

## 2016-05-13 DIAGNOSIS — M10071 Idiopathic gout, right ankle and foot: Secondary | ICD-10-CM

## 2016-05-13 DIAGNOSIS — R7989 Other specified abnormal findings of blood chemistry: Secondary | ICD-10-CM

## 2016-05-13 LAB — COMPLETE METABOLIC PANEL WITH GFR
ALT: 20 U/L (ref 9–46)
AST: 17 U/L (ref 10–40)
Albumin: 4.7 g/dL (ref 3.6–5.1)
Alkaline Phosphatase: 65 U/L (ref 40–115)
BUN: 15 mg/dL (ref 7–25)
CO2: 28 mmol/L (ref 20–31)
Calcium: 9.7 mg/dL (ref 8.6–10.3)
Chloride: 100 mmol/L (ref 98–110)
Creat: 1.25 mg/dL (ref 0.60–1.35)
GFR, Est African American: 81 mL/min (ref >=60)
GFR, Est Non African American: 70 mL/min (ref >=60)
Glucose, Bld: 107 mg/dL — ABNORMAL HIGH (ref 65–99)
Potassium: 5.1 mmol/L (ref 3.5–5.3)
Sodium: 138 mmol/L (ref 135–146)
Total Bilirubin: 0.6 mg/dL (ref 0.2–1.2)
Total Protein: 7.3 g/dL (ref 6.1–8.1)

## 2016-05-13 LAB — URIC ACID: Uric Acid, Serum: 6.1 mg/dL (ref 4.0–7.8)

## 2016-05-13 MED ORDER — PREDNISONE 20 MG PO TABS: 40.0000 mg | ORAL_TABLET | Freq: Every day | ORAL | Status: DC

## 2016-05-13 MED ORDER — HYDROCODONE-ACETAMINOPHEN 5-325 MG PO TABS: 1.0000 | ORAL_TABLET | Freq: Four times a day (QID) | ORAL | Status: DC | PRN

## 2016-05-13 MED ORDER — METHYLPREDNISOLONE SODIUM SUCC 125 MG IJ SOLR
125.0000 mg | Freq: Once | INTRAMUSCULAR | Status: AC
  Administered 2016-05-13: 125 mg via INTRAMUSCULAR

## 2016-05-13 NOTE — Patient Instructions (Signed)
     IF you received an x-ray today, you will receive an invoice from [facility]. Please contact [facility] at [phone] with questions or concerns regarding your invoice.   IF you received labwork today, you will receive an invoice from [facility]. Please contact [facility] at [phone] with questions or concerns regarding your invoice.   Our billing staff will not be able to assist you with questions regarding bills from these companies.  You will be contacted with the lab results as soon as they are available. The fastest way to get your results is to activate your My Chart account. Instructions are located on the last page of this paperwork. If you have not heard from us regarding the results in 2 weeks, please contact this office.      

## 2016-05-13 NOTE — Progress Notes (Signed)
05/13/2016 8:41 AM   DOB: 01-10-1971 / MRN: [id number]  SUBJECTIVE:  Elijah King is a 45 y.o. male presenting for a swollen and very painful right MTP.  He has a history of gout.  This began three days ago and he dose not have any medications to take.  He denies a personal history of diabetes.  CHL shows a history of elevated serum creatinine.   He has No Known Allergies.   He  has a past medical history of Chronic kidney disease and Gout.    He  reports that he has been smoking Cigarettes.  He started smoking about 27 years ago. He has been smoking about 0.25 packs per day. He has never used smokeless tobacco. He reports that he drinks alcohol. He reports that he does not use illicit drugs. He  has no sexual activity history on file. The patient  has past surgical history that includes Kidney cyst removal.  His family history is not on file.  Review of Systems  Constitutional: Negative for fever.  Genitourinary: Negative for dysuria.  Musculoskeletal: Positive for joint pain. Negative for myalgias, back pain, falls and neck pain.  Neurological: Negative for dizziness and headaches.    Problem list and medications reviewed and updated by myself where necessary, and exist elsewhere in the encounter.   OBJECTIVE:  BP 130/90 mmHg  Pulse 95  Temp(Src) 98.3 F (36.8 C) (Oral)  Resp 16  Ht 6\' 1"  (1.854 m)  Wt 213 lb (96.616 kg)  BMI 28.11 kg/m2  SpO2 98%  Physical Exam  Constitutional: He is oriented to person, place, and time. He appears well-developed. He does not appear ill.  Eyes: Conjunctivae and EOM are normal. Pupils are equal, round, and reactive to light.  Cardiovascular: Normal rate, regular rhythm and normal heart sounds.   Pulmonary/Chest: Effort normal and breath sounds normal. No respiratory distress. He has no wheezes. He has no rales.  Abdominal: He exhibits no distension.  Musculoskeletal: Normal range of motion.       Feet:  Neurological: He is alert and  oriented to person, place, and time. No cranial nerve deficit. Coordination normal.  Skin: Skin is warm and dry. He is not diaphoretic.  Psychiatric: He has a normal mood and affect.  Nursing note and vitals reviewed.   No results found for this or any previous visit (from the past 72 hour(s)).  No results found.  ASSESSMENT AND PLAN  Elijah King was seen today for gout.  Diagnoses and all orders for this visit:  Elevated serum creatinine -     COMPLETE METABOLIC PANEL WITH GFR  Acute gout of right foot, unspecified cause: He appears to be in severe pain.  Will start a high dose of pred today and follow with 5 days of 40 mg daily.  Will prescribe opioid pain meds in the short term.  Advised I will not refill.   -     Uric Acid -     methylPREDNISolone sodium succinate (SOLU-MEDROL) 125 mg/2 mL injection 125 mg; Inject 2 mLs (125 mg total) into the muscle once. -     predniSONE (DELTASONE) 20 MG tablet; Take 2 tablets (40 mg total) by mouth daily with breakfast. -     HYDROcodone-acetaminophen (NORCO) 5-325 MG tablet; Take 1 tablet by mouth every 6 (six) hours as needed for severe pain (May cause constipation). For severe pain only.    The patient was advised to call or return to clinic if he does  not see an improvement in symptoms or to seek the care of the closest emergency department if he worsens with the above plan.   Elijah King, MHS, PA-C [facility] 05/13/2016 8:41 AM

## 2016-08-26 ENCOUNTER — Ambulatory Visit (INDEPENDENT_AMBULATORY_CARE_PROVIDER_SITE_OTHER): Payer: 59

## 2016-08-26 ENCOUNTER — Ambulatory Visit (INDEPENDENT_AMBULATORY_CARE_PROVIDER_SITE_OTHER): Payer: 59 | Admitting: Urgent Care

## 2016-08-26 VITALS — BP 142/98 | HR 104 | Temp 99.0°F | Resp 16 | Ht 73.0 in | Wt 216.0 lb

## 2016-08-26 DIAGNOSIS — R030 Elevated blood-pressure reading, without diagnosis of hypertension: Secondary | ICD-10-CM

## 2016-08-26 DIAGNOSIS — M79671 Pain in right foot: Secondary | ICD-10-CM | POA: Diagnosis not present

## 2016-08-26 DIAGNOSIS — F17200 Nicotine dependence, unspecified, uncomplicated: Secondary | ICD-10-CM

## 2016-08-26 DIAGNOSIS — Z8739 Personal history of other diseases of the musculoskeletal system and connective tissue: Secondary | ICD-10-CM

## 2016-08-26 DIAGNOSIS — Z8639 Personal history of other endocrine, nutritional and metabolic disease: Secondary | ICD-10-CM | POA: Diagnosis not present

## 2016-08-26 LAB — CBC
HCT: 45.7 % (ref 38.5–50.0)
Hemoglobin: 15.5 g/dL (ref 13.2–17.1)
MCH: 30.8 pg (ref 27.0–33.0)
MCHC: 33.9 g/dL (ref 32.0–36.0)
MCV: 90.9 fL (ref 80.0–100.0)
MPV: 10.5 fL (ref 7.5–12.5)
Platelets: 209 10*3/uL (ref 140–400)
RBC: 5.03 MIL/uL (ref 4.20–5.80)
RDW: 13.1 % (ref 11.0–15.0)
WBC: 9.1 10*3/uL (ref 3.8–10.8)

## 2016-08-26 LAB — URIC ACID: Uric Acid, Serum: 4.5 mg/dL (ref 4.0–8.0)

## 2016-08-26 LAB — SEDIMENTATION RATE: Sed Rate: 8 mm/hr (ref 0–15)

## 2016-08-26 MED ORDER — INDOMETHACIN 50 MG PO CAPS: 50.0000 mg | ORAL_CAPSULE | Freq: Three times a day (TID) | ORAL | 6 refills | Status: DC

## 2016-08-26 NOTE — Progress Notes (Signed)
    MRN: [id number] DOB: 1971-09-19  Subjective:   Elijah King is a 45 y.o. male presenting for chief complaint of Gout (In right foot - states he had it in left foot last week )  Reports 5 day history of worsening right foot pain. Pain actually started in the left foot and moved to his right. States that his foot is red and swollen. He has been ambulating in his crutches. Used left over indomethacin which helped but he only had 2 pills. Eats a lot of protein, does not try to eat healthily. Drinks ~12 pack per week. Denies fever, numbness and tingling, trauma, chest pain, shob. Denies history of HTN. Smokes 1/2 ppd, is interested in quitting but has never tried to quit.  Elijah King has a current medication list which includes the following prescription(s): indomethacin. Also has No Known Allergies.  Elijah King  has a past medical history of Chronic kidney disease and Gout. Also  has a past surgical history that includes Kidney cyst removal.  Objective:   Vitals: BP (!) 142/98 (BP Location: Right Arm, Patient Position: Sitting, Cuff Size: Large)   Pulse (!) 104   Temp 99 F (37.2 C) (Oral)   Resp 16   Ht 6\' 1"  (1.854 m)   Wt 216 lb (98 kg)   SpO2 96%   BMI 28.50 kg/m   Physical Exam  Constitutional: He is oriented to person, place, and time. He appears well-developed and well-nourished.  HENT:  Mouth/Throat: Oropharynx is clear and moist.  Eyes: No scleral icterus.  Cardiovascular: Normal rate, regular rhythm and intact distal pulses.  Exam reveals no gallop and no friction rub.   No murmur heard. Pulmonary/Chest: Effort normal. No respiratory distress. He has no wheezes. He has no rales.  Musculoskeletal:       Right foot: There is tenderness (over 1st MTP, dorsal aspect of foot up to ankle) and swelling (with associated erythema over 1st MTP). There is normal range of motion, no bony tenderness, normal capillary refill, no crepitus, no deformity and no laceration.  Neurological: He is  alert and oriented to person, place, and time.   No results found.   Assessment and Plan :   1. Right foot pain 2. History of gout - Labs pending, start indomethacin. Do not use allopurinol during gout attacks. Recheck in 1-2 weeks if no improvement.  3. Tobacco use disorder - Counseled on nicotine replacement, recheck in 4 weeks.  4. Elevated blood pressure reading without diagnosis of hypertension - Monitor, recheck in 4 weeks.   Jaynee Eagles, PA-C [facility] [phone] 08/26/2016 8:28 AM

## 2016-08-26 NOTE — Patient Instructions (Addendum)
Gout Gout is an inflammatory arthritis caused by a buildup of uric acid crystals in the joints. Uric acid is a chemical that is normally present in the blood. When the level of uric acid in the blood is too high it can form crystals that deposit in your joints and tissues. This causes joint redness, soreness, and swelling (inflammation). Repeat attacks are common. Over time, uric acid crystals can form into masses (tophi) near a joint, destroying bone and causing disfigurement. Gout is treatable and often preventable. CAUSES  The disease begins with elevated levels of uric acid in the blood. Uric acid is produced by your body when it breaks down a naturally found substance called purines. Certain foods you eat, such as meats and fish, contain high amounts of purines. Causes of an elevated uric acid level include:  Being passed down from parent to child (heredity).  Diseases that cause increased uric acid production (such as obesity, psoriasis, and certain cancers).  Excessive alcohol use.  Diet, especially diets rich in meat and seafood.  Medicines, including certain cancer-fighting medicines (chemotherapy), water pills (diuretics), and aspirin.  Chronic kidney disease. The kidneys are no longer able to remove uric acid well.  Problems with metabolism. Conditions strongly associated with gout include:  Obesity.  High blood pressure.  High cholesterol.  Diabetes. Not everyone with elevated uric acid levels gets gout. It is not understood why some people get gout and others do not. Surgery, joint injury, and eating too much of certain foods are some of the factors that can lead to gout attacks. SYMPTOMS   An attack of gout comes on quickly. It causes intense pain with redness, swelling, and warmth in a joint.  Fever can occur.  Often, only one joint is involved. Certain joints are more commonly involved:  Base of the big toe.  Knee.  Ankle.  Wrist.  Finger. Without  treatment, an attack usually goes away in a few days to weeks. Between attacks, you usually will not have symptoms, which is different from many other forms of arthritis. DIAGNOSIS  Your caregiver will suspect gout based on your symptoms and exam. In some cases, tests may be recommended. The tests may include:  Blood tests.  Urine tests.  X-rays.  Joint fluid exam. This exam requires a needle to remove fluid from the joint (arthrocentesis). Using a microscope, gout is confirmed when uric acid crystals are seen in the joint fluid. TREATMENT  There are two phases to gout treatment: treating the sudden onset (acute) attack and preventing attacks (prophylaxis).  Treatment of an Acute Attack.  Medicines are used. These include anti-inflammatory medicines or steroid medicines.  An injection of steroid medicine into the affected joint is sometimes necessary.  The painful joint is rested. Movement can worsen the arthritis.  You may use warm or cold treatments on painful joints, depending which works best for you.  Treatment to Prevent Attacks.  If you suffer from frequent gout attacks, your caregiver may advise preventive medicine. These medicines are started after the acute attack subsides. These medicines either help your kidneys eliminate uric acid from your body or decrease your uric acid production. You may need to stay on these medicines for a very long time.  The early phase of treatment with preventive medicine can be associated with an increase in acute gout attacks. For this reason, during the first few months of treatment, your caregiver may also advise you to take medicines usually used for acute gout treatment. Be sure you   understand your caregiver's directions. Your caregiver may make several adjustments to your medicine dose before these medicines are effective.  Discuss dietary treatment with your caregiver or dietitian. Alcohol and drinks high in sugar and fructose and foods  such as meat, poultry, and seafood can increase uric acid levels. Your caregiver or dietitian can advise you on drinks and foods that should be limited. HOME CARE INSTRUCTIONS   Do not take aspirin to relieve pain. This raises uric acid levels.  Only take over-the-counter or prescription medicines for pain, discomfort, or fever as directed by your caregiver.  Rest the joint as much as possible. When in bed, keep sheets and blankets off painful areas.  Keep the affected joint raised (elevated).  Apply warm or cold treatments to painful joints. Use of warm or cold treatments depends on which works best for you.  Use crutches if the painful joint is in your leg.  Drink enough fluids to keep your urine clear or pale yellow. This helps your body get rid of uric acid. Limit alcohol, sugary drinks, and fructose drinks.  Follow your dietary instructions. Pay careful attention to the amount of protein you eat. Your daily diet should emphasize fruits, vegetables, whole grains, and fat-free or low-fat milk products. Discuss the use of coffee, vitamin C, and cherries with your caregiver or dietitian. These may be helpful in lowering uric acid levels.  Maintain a healthy body weight. SEEK MEDICAL CARE IF:   You develop diarrhea, vomiting, or any side effects from medicines.  You do not feel better in 24 hours, or you are getting worse. SEEK IMMEDIATE MEDICAL CARE IF:   Your joint becomes suddenly more tender, and you have chills or a fever. MAKE SURE YOU:   Understand these instructions.  Will watch your condition.  Will get help right away if you are not doing well or get worse.   This information is not intended to replace advice given to you by your health care provider. Make sure you discuss any questions you have with your health care provider.   Document Released: 12/08/2000 Document Revised: 01/01/2015 Document Reviewed: 07/24/2012 Elsevier Interactive Patient Education 2016  [employer]     IF you received an x-ray today, you will receive an invoice from [facility]. Please contact [facility] at [phone] with questions or concerns regarding your invoice.   IF you received labwork today, you will receive an invoice from [facility]. Please contact [facility] at [phone] with questions or concerns regarding your invoice.   Our billing staff will not be able to assist you with questions regarding bills from these companies.  You will be contacted with the lab results as soon as they are available. The fastest way to get your results is to activate your My Chart account. Instructions are located on the last page of this paperwork. If you have not heard from us regarding the results in 2 weeks, please contact this office.      

## 2016-09-05 ENCOUNTER — Encounter: Payer: Self-pay | Admitting: Urgent Care

## 2017-03-27 ENCOUNTER — Telehealth: Payer: Self-pay | Admitting: *Deleted

## 2017-03-27 ENCOUNTER — Encounter: Payer: Self-pay | Admitting: *Deleted

## 2017-03-27 NOTE — Telephone Encounter (Signed)
PreVisit Call completed. No acute complaints. States that it has been a long time since he has had a physical. Blood work within the last 6 months for gout. Believes he has had a Tdap within the last 5 years but cannot recall exact date. Asked pt to bring in any medical records/immunization records he may have.

## 2017-03-27 NOTE — Progress Notes (Signed)
Pre visit review using our clinic review tool, if applicable. No additional management support is needed unless otherwise documented below in the visit note. 

## 2017-03-27 NOTE — Progress Notes (Addendum)
Elijah King is a 46 y.o. male here to Establish Care and Annual Physical.  History of Present Illness:   Chief Complaint  Patient presents with  . Establish Care  . Annual Exam    [employer]    Acute Concerns: None  Chronic Issues: Gout -- 6 months ago was last flare, was occurring every 3 months, indomethacin works him Tobacco abuse -- 1/2 PPD for 30 years, ready to quit, no serious attempts at smoking, has never tried the patch or gum, ready to quit Elevated blood pressure reading -- Per chart review patient's blood pressure was 142/98 in September 2017 when he had a gout flare. He does not check his blood pressure home and has never had a diagnosis of hypertension. Solitary kidney -- had surgery when he was 11 Obesity -- patient has a sedentary lifestyle, does not often eat healthy foods. GERD -- used to be very severe, however he says that his diet has helped with control of this, and that he takes omeprazole a few times a week to help with this. Hypersomnia -- excessive daytime sleepiness, has had difficulty sleeping for quite some time 2/2 snoring  BP Readings from Last 3 Encounters:  03/28/17 (!) 160/100  08/26/16 (!) 142/98  05/13/16 130/90   Health Maintenance: Immunizations -- Tdap today Colonoscopy -- no family hx  PSA -- no family hx Diet -- occasional weekly outing to [employer], eat all food groups Caffeine intake -- Does not drink coffee, does have an occasional Dr. Malachi Bonds. Sleep habits -- gets about 7 hours of sleep per night  Exercise -- does not exercise Weight -- Weight: 218 lb 8 oz (99.1 kg)  Mood -- no issues with depression or anxiety  Depression screen [facility] 2/9 03/28/2017  Decreased Interest 0  Down, Depressed, Hopeless 0  PHQ - 2 Score 0   Other providers/specialists: None  PMHx, SurgHx, SocialHx, Medications, and Allergies were reviewed in the Visit Navigator and updated as appropriate.  Current Medications:   Current Outpatient Prescriptions:  .   ibuprofen (ADVIL) 200 MG tablet, Take 400 mg by mouth as needed., Disp: , Rfl:  .  indomethacin (INDOCIN) 50 MG capsule, Take 1 capsule (50 mg total) by mouth 3 (three) times daily with meals. (Patient taking differently: Take 50 mg by mouth daily. ), Disp: 60 capsule, Rfl: 6   Review of Systems:   Review of Systems  Constitutional: Negative.  Negative for fever, malaise/fatigue and weight loss.  HENT: Negative.  Negative for ear discharge, ear pain and hearing loss.   Eyes: Negative.  Negative for blurred vision.  Respiratory: Negative.  Negative for cough and shortness of breath.   Cardiovascular: Negative.  Negative for chest pain, palpitations and claudication.  Gastrointestinal: Positive for heartburn. Negative for abdominal pain, blood in stool, constipation, diarrhea, nausea and vomiting.  Genitourinary: Negative.  Negative for dysuria, frequency and urgency.  Musculoskeletal: Negative.  Negative for myalgias and neck pain.  Skin: Negative.  Negative for rash.  Neurological: Negative.  Negative for dizziness, sensory change, focal weakness and headaches.  Endo/Heme/Allergies: Negative.   Psychiatric/Behavioral: Negative.  Negative for depression. The patient is not nervous/anxious.     Vitals:   Vitals:   03/28/17 1010 03/28/17 1047  BP: (!) 150/98 (!) 160/100  Pulse: 80   Temp: 99.1 F (37.3 C)   TempSrc: Oral   SpO2: 96%   Weight: 218 lb 8 oz (99.1 kg)   Height: 5' 11.5" (1.816 m)  Body mass index is 30.05 kg/m.  Physical Exam:   Physical Exam  Constitutional: He appears well-developed. He is cooperative.  Non-toxic appearance. He does not have a sickly appearance. He does not appear ill. No distress.  HENT:  Head: Normocephalic and atraumatic.  Right Ear: External ear and ear canal normal.  Left Ear: External ear and ear canal normal.  Nose: Nose normal.  Mouth/Throat: Uvula is midline and oropharynx is clear and moist.  Unable to visualize bilateral  tympanic membranes secondary to cerumen  Eyes: Conjunctivae, EOM and lids are normal. Pupils are equal, round, and reactive to light.  Neck: Trachea normal.  Cardiovascular: Normal rate, regular rhythm, S1 normal, S2 normal, normal heart sounds and normal pulses.   Pulmonary/Chest: Effort normal and breath sounds normal.  Abdominal: Normal appearance and bowel sounds are normal. There is no tenderness.  Lymphadenopathy:    He has no cervical adenopathy.  Neurological: He is alert.  Skin: Skin is warm, dry and intact.  Psychiatric: He has a normal mood and affect. His speech is normal and behavior is normal. Thought content normal.  Nursing note and vitals reviewed.    Assessment and Plan:    Problem List Items Addressed This Visit      Digestive   Gastroesophageal reflux disease    Currently well controlled. Takes omeprazole once every 3-4 days. Has changed his diet to avoid significant triggers.        Musculoskeletal and Integument   Polyarticular gout    Currently well-controlled on indomethacin. Last flare was 6 months ago.      Relevant Medications   ibuprofen (ADVIL) 200 MG tablet     Genitourinary   Chronic kidney disease    In 2016, creatinine was 1.39. This is the only lab I have on him. I have put in orders to redraw this. He has not followed by nephrology.        Other   Elevated blood pressure reading    Blood pressure today concerning. We had long discussion about diet changes, need to stop smoking, and need for exercise. Also I'm concerned he has sleep apnea which we are getting a sleep study for. He is to follow-up in 2 weeks for nurse blood pressure check.      Relevant Orders   Lipid panel   Hypersomnia    History sounds suspicious for sleep apnea. I have ordered a home sleep study. Question of this is attributing to his elevated blood pressure.       Relevant Orders   Home sleep test   Solitary kidney, acquired    We'll order labs today to assess  kidney function. Currently asymptomatic.      Tobacco abuse    Patient has a long history of smoking. He is ready to quit. He is interested in quitting cold [place]. He is most let us know if he needs any medications or other treatment modalities to help him quit.      Relevant Orders   Lipid panel   Obesity (BMI 30-39.9)    Patient is interested in working on diet, exercise, cutting back on smoking, and making some life changes. I commended him for this effort. Provided him with information on heart healthy diet.      Relevant Orders   Lipid panel    Other Visit Diagnoses    Routine health maintenance    -  Primary Today patient counseled on age appropriate routine health concerns for screening and prevention, each  reviewed and up to date or declined. Immunizations reviewed and up to date or declined. Labs ordered and reviewed. Risk factors for depression reviewed and negative. Hearing function and visual acuity are intact. ADLs screened and addressed as needed. Functional ability and level of safety reviewed and appropriate. Education, counseling and referrals performed based on assessed risks today. Patient provided with a copy of personalized plan for preventive services.   Relevant Orders   CBC with Differential/Platelet   Comprehensive metabolic panel   Lipid panel   HIV antibody   Need for prophylactic vaccination with combined diphtheria-tetanus-pertussis (DTP) vaccine       Relevant Orders   Tdap vaccine greater than or equal to 7yo IM (Completed)     Patient is not fasting today, he will return to repeat all labs.  . Reviewed expectations re: course of current medical issues. . Discussed self-management of symptoms. . Outlined signs and symptoms indicating need for more acute intervention. . Patient verbalized understanding and all questions were answered. . See orders for this visit as documented in the electronic medical record. . Patient received an After-Visit  Summary.   [name], PA-C

## 2017-03-28 ENCOUNTER — Ambulatory Visit (INDEPENDENT_AMBULATORY_CARE_PROVIDER_SITE_OTHER): Payer: 59 | Admitting: Physician Assistant

## 2017-03-28 ENCOUNTER — Encounter: Payer: Self-pay | Admitting: Physician Assistant

## 2017-03-28 VITALS — BP 160/100 | HR 80 | Temp 99.1°F | Ht 71.5 in | Wt 218.5 lb

## 2017-03-28 DIAGNOSIS — G4710 Hypersomnia, unspecified: Secondary | ICD-10-CM | POA: Diagnosis not present

## 2017-03-28 DIAGNOSIS — Z23 Encounter for immunization: Secondary | ICD-10-CM

## 2017-03-28 DIAGNOSIS — R030 Elevated blood-pressure reading, without diagnosis of hypertension: Secondary | ICD-10-CM

## 2017-03-28 DIAGNOSIS — Z0000 Encounter for general adult medical examination without abnormal findings: Secondary | ICD-10-CM | POA: Diagnosis not present

## 2017-03-28 DIAGNOSIS — N189 Chronic kidney disease, unspecified: Secondary | ICD-10-CM | POA: Diagnosis not present

## 2017-03-28 DIAGNOSIS — M1000 Idiopathic gout, unspecified site: Secondary | ICD-10-CM

## 2017-03-28 DIAGNOSIS — M109 Gout, unspecified: Secondary | ICD-10-CM

## 2017-03-28 DIAGNOSIS — Z905 Acquired absence of kidney: Secondary | ICD-10-CM

## 2017-03-28 DIAGNOSIS — R0683 Snoring: Secondary | ICD-10-CM | POA: Insufficient documentation

## 2017-03-28 DIAGNOSIS — K219 Gastro-esophageal reflux disease without esophagitis: Secondary | ICD-10-CM | POA: Diagnosis not present

## 2017-03-28 DIAGNOSIS — Z87891 Personal history of nicotine dependence: Secondary | ICD-10-CM | POA: Insufficient documentation

## 2017-03-28 DIAGNOSIS — Z720 Tobacco use: Secondary | ICD-10-CM

## 2017-03-28 DIAGNOSIS — E669 Obesity, unspecified: Secondary | ICD-10-CM

## 2017-03-28 NOTE — Assessment & Plan Note (Signed)
Currently well-controlled on indomethacin. Last flare was 6 months ago.

## 2017-03-28 NOTE — Patient Instructions (Addendum)
Please make an appointment with the lab on your way out. I would like for you to return for lab work within 1 week. After midnight the day of your lab draw, please do not eat anything. You may have water, black coffee, unsweetened tea.  You will be contacted about your sleep study.  Please follow-up with [place] in two weeks for a nurse BP check.   Health Maintenance, Male A healthy lifestyle and preventive care is important for your health and wellness. Ask your health care provider about what schedule of regular examinations is right for you. What should I know about weight and diet?  Eat a Healthy Diet  Eat plenty of vegetables, fruits, whole grains, low-fat dairy products, and lean protein.  Do not eat a lot of foods high in solid fats, added sugars, or salt. Maintain a Healthy Weight  Regular exercise can help you achieve or maintain a healthy weight. You should:  Do at least 150 minutes of exercise each week. The exercise should increase your heart rate and make you sweat (moderate-intensity exercise).  Do strength-training exercises at least twice a week. Watch Your Levels of Cholesterol and Blood Lipids  Have your blood tested for lipids and cholesterol every 5 years starting at 45 years of age. If you are at high risk for heart disease, you should start having your blood tested when you are 45 years old. You may need to have your cholesterol levels checked more often if:  Your lipid or cholesterol levels are high.  You are older than 45 years of age.  You are at high risk for heart disease. What should I know about cancer screening? Many types of cancers can be detected early and may often be prevented. Lung Cancer  You should be screened every year for lung cancer if:  You are a current smoker who has smoked for at least 30 years.  You are a former smoker who has quit within the past 15 years.  Talk to your health care provider about your screening options, when you  should start screening, and how often you should be screened. Colorectal Cancer  Routine colorectal cancer screening usually begins at 45 years of age and should be repeated every 5-10 years until you are 45 years old. You may need to be screened more often if early forms of precancerous polyps or small growths are found. Your health care provider may recommend screening at an earlier age if you have risk factors for colon cancer.  Your health care provider may recommend using home test kits to check for hidden blood in the stool.  A small camera at the end of a tube can be used to examine your colon (sigmoidoscopy or colonoscopy). This checks for the earliest forms of colorectal cancer. Prostate and Testicular Cancer  Depending on your age and overall health, your health care provider may do certain tests to screen for prostate and testicular cancer.  Talk to your health care provider about any symptoms or concerns you have about testicular or prostate cancer. Skin Cancer  Check your skin from head to toe regularly.  Tell your health care provider about any new moles or changes in moles, especially if:  There is a change in a mole's size, shape, or color.  You have a mole that is larger than a pencil eraser.  Always use sunscreen. Apply sunscreen liberally and repeat throughout the day.  Protect yourself by wearing long sleeves, pants, a wide-brimmed hat, and sunglasses when  outside. What should I know about heart disease, diabetes, and high blood pressure?  If you are 80-73 years of age, have your blood pressure checked every 3-5 years. If you are 51 years of age or older, have your blood pressure checked every year. You should have your blood pressure measured twice-once when you are at a hospital or clinic, and once when you are not at a hospital or clinic. Record the average of the two measurements. To check your blood pressure when you are not at a hospital or clinic, you can  use:  An automated blood pressure machine at a pharmacy.  A home blood pressure monitor.  Talk to your health care provider about your target blood pressure.  If you are between 65-62 years old, ask your health care provider if you should take aspirin to prevent heart disease.  Have regular diabetes screenings by checking your fasting blood sugar level.  If you are at a normal weight and have a low risk for diabetes, have this test once every three years after the age of 48.  If you are overweight and have a high risk for diabetes, consider being tested at a younger age or more often.  A one-time screening for abdominal aortic aneurysm (AAA) by ultrasound is recommended for men aged 20-75 years who are current or former smokers. What should I know about preventing infection? Hepatitis B  If you have a higher risk for hepatitis B, you should be screened for this virus. Talk with your health care provider to find out if you are at risk for hepatitis B infection. Hepatitis C  Blood testing is recommended for:  Everyone born from [birth year] through 1964.  Anyone with known risk factors for hepatitis C. Sexually Transmitted Diseases (STDs)  You should be screened each year for STDs including gonorrhea and chlamydia if:  You are sexually active and are younger than 45 years of age.  You are older than 45 years of age and your health care provider tells you that you are at risk for this type of infection.  Your sexual activity has changed since you were last screened and you are at an increased risk for chlamydia or gonorrhea. Ask your health care provider if you are at risk.  Talk with your health care provider about whether you are at high risk of being infected with HIV. Your health care provider may recommend a prescription medicine to help prevent HIV infection. What else can I do?  Schedule regular health, dental, and eye exams.  Stay current with your vaccines  (immunizations).  Do not use any tobacco products, such as cigarettes, chewing tobacco, and e-cigarettes. If you need help quitting, ask your health care provider.  Limit alcohol intake to no more than 2 drinks per day. One drink equals 12 ounces of beer, 5 ounces of wine, or 1 ounces of hard liquor.  Do not use street drugs.  Do not share needles.  Ask your health care provider for help if you need support or information about quitting drugs.  Tell your health care provider if you often feel depressed.  Tell your health care provider if you have ever been abused or do not feel safe at home. This information is not intended to replace advice given to you by your health care provider. Make sure you discuss any questions you have with your health care provider. Document Released: 06/08/2008 Document Revised: 08/09/2016 Document Reviewed: 09/14/2015 Elsevier Interactive Patient Education  2017 [employer].  Smoking Tobacco Information Smoking tobacco will very likely harm your health. Tobacco contains a poisonous (toxic), colorless chemical called nicotine. Nicotine affects the brain and makes tobacco addictive. This change in your brain can make it hard to stop smoking. Tobacco also has other toxic chemicals that can hurt your body and raise your risk of many cancers. How can smoking tobacco affect me? Smoking tobacco can increase your chances of having serious health conditions, such as:  Cancer. Smoking is most commonly associated with lung cancer, but can lead to cancer in other parts of the body.  Chronic obstructive pulmonary disease (COPD). This is a long-term lung condition that makes it hard to breathe. It also gets worse over time.  High blood pressure (hypertension), heart disease, stroke, or heart attack.  Lung infections, such as pneumonia.  Cataracts. This is when the lenses in the eyes become clouded.  Digestive problems. This may include peptic ulcers, heartburn,  and gastroesophageal reflux disease (GERD).  Oral health problems, such as gum disease and tooth loss.  Loss of taste and smell. Smoking can affect your appearance by causing:  Wrinkles.  Yellow or stained teeth, fingers, and fingernails. Smoking tobacco can also affect your social life.  Many workplaces, [employer], hotels, and public places are tobacco-free. This means that you may experience challenges in finding places to smoke when away from home.  The cost of a smoking habit can be expensive. Expenses for someone who smokes come in two ways:  You spend money on a regular basis to buy tobacco.  Your health care costs in the long-term are higher if you smoke.  Tobacco smoke can also affect the health of those around you. Children of smokers have greater chances of:  Sudden infant death syndrome (SIDS).  Ear infections.  Lung infections. What lifestyle changes can be made?  Do not start smoking. Quit if you already do.  To quit smoking:  Make a plan to quit smoking and commit yourself to it. Look for programs to help you and ask your health care provider for recommendations and ideas.  Talk with your health care provider about using nicotine replacement medicines to help you quit. Medicine replacement medicines include gum, lozenges, patches, sprays, or pills.  Do not replace cigarette smoking with electronic cigarettes, which are commonly called e-cigarettes. The safety of e-cigarettes is not known, and some may contain harmful chemicals.  Avoid places, people, or situations that tempt you to smoke.  If you try to quit but return to smoking, don't give up hope. It is very common for people to try a number of times before they fully succeed. When you feel ready again, give it another try.  Quitting smoking might affect the way you eat as well as your weight. Be prepared to monitor your eating habits. Get support in planning and following a healthy diet.  Ask your  health care provider about having regular tests (screenings) to check for cancer. This may include blood tests, imaging tests, and other tests.  Exercise regularly. Consider taking walks, joining a gym, or doing yoga or exercise classes.  Develop skills to manage your stress. These skills include meditation. What are the benefits of quitting smoking? By quitting smoking, you may:  Lower your risk of getting cancer and other diseases caused by smoking.  Live longer.  Breathe better.  Lower your blood pressure and heart rate.  Stop your addiction to tobacco.  Stop creating secondhand smoke that hurts other people.  Improve your sense of  taste and smell.  Look better over time, due to having fewer wrinkles and less staining. What can happen if changes are not made? If you do not stop smoking, you may:  Get cancer and other diseases.  Develop COPD or other long-term (chronic) lung conditions.  Develop serious problems with your heart and blood vessels (cardiovascular system).  Need more tests to screen for problems caused by smoking.  Have higher, long-term healthcare costs from medicines or treatments related to smoking.  Continue to have worsening changes in your lungs, mouth, and nose. Where to find support: To get support to quit smoking, consider:  Asking your health care provider for more information and resources.  Taking classes to learn more about quitting smoking.  Looking for local organizations that offer resources about quitting smoking.  Joining a support group for people who want to quit smoking in your local community. Where to find more information: You may find more information about quitting smoking from:  HelpGuide.org: www.helpguide.org/articles/addictions/how-to-quit-smoking.htm  https://Horace.com/: smokefree.gov  American Lung Association: www.lung.org Contact a health care provider if:  You have problems breathing.  Your lips, nose, or fingers  turn blue.  You have chest pain.  You are coughing up blood.  You feel faint or you pass out.  You have other noticeable changes that cause you to worry. Summary  Smoking tobacco can negatively affect your health, the health of those around you, your finances, and your social life.  Do not start smoking. Quit if you already do. If you need help quitting, ask your health care provider.  Think about joining a support group for people who want to quit smoking in your local community. There are many effective programs that will help you to quit this behavior. This information is not intended to replace advice given to you by your health care provider. Make sure you discuss any questions you have with your health care provider. Document Released: 12/26/2016 Document Revised: 12/26/2016 Document Reviewed: 12/26/2016 Elsevier Interactive Patient Education  2017 [employer].

## 2017-03-28 NOTE — Assessment & Plan Note (Signed)
Blood pressure today concerning. We had long discussion about diet changes, need to stop smoking, and need for exercise. Also I'm concerned he has sleep apnea which we are getting a sleep study for. He is to follow-up in 2 weeks for nurse blood pressure check.

## 2017-03-28 NOTE — Assessment & Plan Note (Signed)
History sounds suspicious for sleep apnea. I have ordered a home sleep study. Question of this is attributing to his elevated blood pressure.

## 2017-03-28 NOTE — Assessment & Plan Note (Signed)
In 2016, creatinine was 1.39. This is the only lab I have on him. I have put in orders to redraw this. He has not followed by nephrology.

## 2017-03-28 NOTE — Assessment & Plan Note (Signed)
We'll order labs today to assess kidney function. Currently asymptomatic.

## 2017-03-28 NOTE — Assessment & Plan Note (Signed)
Patient is interested in working on diet, exercise, cutting back on smoking, and making some life changes. I commended him for this effort. Provided him with information on heart healthy diet.

## 2017-03-28 NOTE — Assessment & Plan Note (Signed)
Patient has a long history of smoking. He is ready to quit. He is interested in quitting cold [place]. He is most let us know if he needs any medications or other treatment modalities to help him quit.

## 2017-03-28 NOTE — Assessment & Plan Note (Signed)
Currently well controlled. Takes omeprazole once every 3-4 days. Has changed his diet to avoid significant triggers.

## 2017-04-03 ENCOUNTER — Other Ambulatory Visit: Payer: Self-pay | Admitting: Physician Assistant

## 2017-04-03 DIAGNOSIS — G4733 Obstructive sleep apnea (adult) (pediatric): Secondary | ICD-10-CM

## 2017-04-03 DIAGNOSIS — R0683 Snoring: Secondary | ICD-10-CM

## 2017-04-04 ENCOUNTER — Other Ambulatory Visit: Payer: 59

## 2017-04-04 ENCOUNTER — Other Ambulatory Visit: Payer: Self-pay

## 2017-04-04 DIAGNOSIS — Z720 Tobacco use: Secondary | ICD-10-CM

## 2017-04-04 DIAGNOSIS — R030 Elevated blood-pressure reading, without diagnosis of hypertension: Secondary | ICD-10-CM

## 2017-04-04 DIAGNOSIS — E669 Obesity, unspecified: Secondary | ICD-10-CM

## 2017-04-04 DIAGNOSIS — Z0000 Encounter for general adult medical examination without abnormal findings: Secondary | ICD-10-CM

## 2017-04-05 ENCOUNTER — Other Ambulatory Visit: Payer: 59

## 2017-04-05 NOTE — Addendum Note (Signed)
Addended by: Frutoso Chase A on: 04/05/2017 08:48 AM   Modules accepted: Orders

## 2017-04-11 ENCOUNTER — Other Ambulatory Visit (INDEPENDENT_AMBULATORY_CARE_PROVIDER_SITE_OTHER): Payer: 59

## 2017-04-11 ENCOUNTER — Ambulatory Visit (INDEPENDENT_AMBULATORY_CARE_PROVIDER_SITE_OTHER): Payer: 59 | Admitting: Physician Assistant

## 2017-04-11 VITALS — BP 130/96

## 2017-04-11 DIAGNOSIS — Z720 Tobacco use: Secondary | ICD-10-CM

## 2017-04-11 DIAGNOSIS — E669 Obesity, unspecified: Secondary | ICD-10-CM

## 2017-04-11 DIAGNOSIS — Z0000 Encounter for general adult medical examination without abnormal findings: Secondary | ICD-10-CM | POA: Diagnosis not present

## 2017-04-11 DIAGNOSIS — R030 Elevated blood-pressure reading, without diagnosis of hypertension: Secondary | ICD-10-CM

## 2017-04-11 LAB — CBC WITH DIFFERENTIAL/PLATELET
Basophils Absolute: 0 10*3/uL (ref 0.0–0.1)
Basophils Relative: 0.5 % (ref 0.0–3.0)
Eosinophils Absolute: 0.1 10*3/uL (ref 0.0–0.7)
Eosinophils Relative: 2.8 % (ref 0.0–5.0)
HCT: 44.8 % (ref 39.0–52.0)
Hemoglobin: 15.1 g/dL (ref 13.0–17.0)
Lymphocytes Relative: 31.2 % (ref 12.0–46.0)
Lymphs Abs: 1.5 10*3/uL (ref 0.7–4.0)
MCHC: 33.6 g/dL (ref 30.0–36.0)
MCV: 91.4 fl (ref 78.0–100.0)
Monocytes Absolute: 0.4 10*3/uL (ref 0.1–1.0)
Monocytes Relative: 8.2 % (ref 3.0–12.0)
Neutro Abs: 2.8 10*3/uL (ref 1.4–7.7)
Neutrophils Relative %: 57.3 % (ref 43.0–77.0)
Platelets: 168 10*3/uL (ref 150.0–400.0)
RBC: 4.9 Mil/uL (ref 4.22–5.81)
RDW: 13.1 % (ref 11.5–15.5)
WBC: 5 10*3/uL (ref 4.0–10.5)

## 2017-04-11 LAB — COMPREHENSIVE METABOLIC PANEL
ALT: 29 U/L (ref 0–53)
AST: 22 U/L (ref 0–37)
Albumin: 4.5 g/dL (ref 3.5–5.2)
Alkaline Phosphatase: 60 U/L (ref 39–117)
BUN: 16 mg/dL (ref 6–23)
CO2: 29 mEq/L (ref 19–32)
Calcium: 9.3 mg/dL (ref 8.4–10.5)
Chloride: 104 mEq/L (ref 96–112)
Creatinine, Ser: 1.19 mg/dL (ref 0.40–1.50)
GFR: 70.39 mL/min (ref >60.00)
Glucose, Bld: 104 mg/dL — ABNORMAL HIGH (ref 70–99)
Potassium: 4.3 mEq/L (ref 3.5–5.1)
Sodium: 138 mEq/L (ref 135–145)
Total Bilirubin: 0.4 mg/dL (ref 0.2–1.2)
Total Protein: 7.2 g/dL (ref 6.0–8.3)

## 2017-04-11 LAB — LIPID PANEL
Cholesterol: 186 mg/dL (ref 0–200)
HDL: 44.5 mg/dL (ref >39.00)
LDL Cholesterol: 116 mg/dL — ABNORMAL HIGH (ref 0–99)
NonHDL: 141.73
Total CHOL/HDL Ratio: 4
Triglycerides: 127 mg/dL (ref 0.0–149.0)
VLDL: 25.4 mg/dL (ref 0.0–40.0)

## 2017-04-11 NOTE — Progress Notes (Signed)
Pt's blood pressure is 130/96. Results reported to [facility]. Pt also had labs drawn this morning so once she reviews them the plan will be to start a low dose blood pressure medicine. He is agreeable to this plan. I provided him with a blood pressure log and heart healthy diet hand out.

## 2017-04-11 NOTE — Progress Notes (Signed)
I agree with the plan, discussed with Jari Sportsman, CMA.  [name] PA-C 04/11/17

## 2017-04-11 NOTE — Progress Notes (Signed)
Pre visit review using our clinic review tool, if applicable. No additional management support is needed unless otherwise documented below in the visit note. 

## 2017-04-12 ENCOUNTER — Other Ambulatory Visit: Payer: Self-pay | Admitting: Physician Assistant

## 2017-04-12 LAB — HIV ANTIBODY (ROUTINE TESTING W REFLEX): HIV 1&2 Ab, 4th Generation: NONREACTIVE

## 2017-04-12 MED ORDER — LISINOPRIL 10 MG PO TABS
10.0000 mg | ORAL_TABLET | Freq: Every day | ORAL | 0 refills | Status: DC
Start: 2017-04-12 — End: 2017-07-09

## 2017-07-09 ENCOUNTER — Ambulatory Visit (INDEPENDENT_AMBULATORY_CARE_PROVIDER_SITE_OTHER): Payer: Self-pay | Admitting: Physician Assistant

## 2017-07-09 ENCOUNTER — Encounter: Payer: Self-pay | Admitting: Physician Assistant

## 2017-07-09 VITALS — BP 110/90 | HR 89 | Ht 71.5 in | Wt 214.0 lb

## 2017-07-09 DIAGNOSIS — M109 Gout, unspecified: Secondary | ICD-10-CM

## 2017-07-09 MED ORDER — PREDNISONE 20 MG PO TABS: 20.0000 mg | ORAL_TABLET | Freq: Every day | ORAL | 0 refills | Status: DC

## 2017-07-09 NOTE — Progress Notes (Signed)
Elijah King is a 46 y.o. male here for a new problem.  Elijah King, cma is acting as a Education administrator for [employer], [place].  History of Present Illness:   Chief Complaint  Patient presents with  . Foot Pain    Elijah King reports a 1-2 week hx of dorsal and plantar left foot pain. The pain started in second toe but has now progressed to second and third toe as well as the ball of his foot. Has pain with walking and wearing shows.Associated sx are swelling and redness. He is taking Indomethacine BID with no relief. He is having diffculty walking. Unable to bear weight. Elevated uric acid level 11-13-2014 at 8.8. Denies any acute injury. Most recent flare of gout was 6-7 months ago. He denies any known dietary triggers.    He has a history of CKD however his last CMP on 04/11/17 showed normal kidney labs.   Past Medical History:  Diagnosis Date  . Chronic kidney disease      Social History   Social History  . Marital status: Married    Spouse name: Elijah King  . Number of children: Elijah King  . Years of education: Elijah King   Occupational History  . Not on file.   Social History Main Topics  . Smoking status: Current Every Day Smoker    Packs/day: 0.50    Types: Cigarettes    Start date: 03/03/1989  . Smokeless tobacco: Never Used  . Alcohol use 0.0 oz/week     Comment: twice weekly 5 drinks total  . Drug use: No  . Sexual activity: Yes   Other Topics Concern  . Not on file   Social History Narrative   Has a desk job   Live in [place]   Married, 1 child (son)    Past Surgical History:  Procedure Laterality Date  . KIDNEY CYST REMOVAL Left 1983    Family History  Problem Relation Age of Onset  . Anuerysm Father   . Colon cancer Neg Hx   . Prostate cancer Neg Hx     No Known Allergies  Current Medications:   Current Outpatient Prescriptions:  .  ibuprofen (ADVIL) 200 MG tablet, Take 400 mg by mouth as needed., Disp: , Rfl:  .  indomethacin (INDOCIN) 50 MG capsule, Take 1 capsule  (50 mg total) by mouth 3 (three) times daily with meals. (Patient taking differently: Take 50 mg by mouth daily. ), Disp: 60 capsule, Rfl: 6 .  predniSONE (DELTASONE) 20 MG tablet, Take 1 tablet (20 mg total) by mouth daily with breakfast. Take 3 tabs for 3 days, then 2 tabs for 3 days, then 1 tab for 3 days., Disp: 18 tablet, Rfl: 0   Review of Systems:   Review of Systems  Constitutional: Negative for chills, diaphoresis, fever, malaise/fatigue and weight loss.  HENT: Negative.   Eyes: Negative for blurred vision, double vision, photophobia, pain and redness.  Respiratory: Negative for cough, hemoptysis and sputum production.   Cardiovascular: Negative for chest pain and palpitations.  Gastrointestinal: Negative for abdominal pain, blood in stool, constipation, diarrhea, heartburn, melena, nausea and vomiting.  Genitourinary: Negative for dysuria, flank pain, frequency, hematuria and urgency.  Musculoskeletal: Positive for joint pain and myalgias. Negative for back pain, falls and neck pain.  Skin: Negative for itching and rash.  Neurological: Negative.  Negative for weakness.  Endo/Heme/Allergies: Negative for environmental allergies and polydipsia. Does not bruise/bleed easily.  Psychiatric/Behavioral: Negative.     Vitals:   Vitals:  07/09/17 1038  BP: 110/90  Pulse: 89  SpO2: 98%  Weight: 214 lb (97.1 kg)  Height: 5' 11.5" (1.816 m)     Body mass index is 29.43 kg/m.  Physical Exam:   Physical Exam  Constitutional: He appears well-developed. He is cooperative.  Non-toxic appearance. He does not have a sickly appearance. He does not appear ill. No distress.  Cardiovascular: Normal rate, regular rhythm, S1 normal, S2 normal, normal heart sounds and normal pulses.   No LE edema; normal capillary refill to L toes  Pulmonary/Chest: Effort normal and breath sounds normal.  Musculoskeletal:  Swelling and erythema to 2nd left toe DIP and PIP joint; tenderness with light  palpation. L dorsal midfoot dorsal surface and medial/lateral malleolus without swelling or significant erythema; decreased ROM of 2nd left toe 2/2 pain. No evidence of infection. No warmth with palpation. No streaking. No open areas with active discharge.  Neurological: He is alert. GCS eye subscore is 4. GCS verbal subscore is 5. GCS motor subscore is 6.  Normal sensation to L toes  Skin: Skin is warm, dry and intact.  Psychiatric: He has a normal mood and affect. His speech is normal and behavior is normal.  Nursing note and vitals reviewed.     Assessment and Plan:    [name] was seen today for foot pain.  Diagnoses and all orders for this visit:  Acute gout of left foot, unspecified cause Indomethacin treatment at home is ineffective, he is no longer taking this. Will treat with oral steroids taper as ordered. Follow-up if symptoms do not improve or worsen. Avoid dietary triggers.  Other orders -     predniSONE (DELTASONE) 20 MG tablet; Take 1 tablet (20 mg total) by mouth daily with breakfast. Take 3 tabs for 3 days, then 2 tabs for 3 days, then 1 tab for 3 days.    . Reviewed expectations re: course of current medical issues. . Discussed self-management of symptoms. . Outlined signs and symptoms indicating need for more acute intervention. . Patient verbalized understanding and all questions were answered. . See orders for this visit as documented in the electronic medical record. . Patient received an After-Visit Summary.  CMA or LPN served as scribe during this visit. History, Physical, and Plan performed by medical provider. Documentation and orders reviewed and attested to.  [name], PA-C

## 2017-07-09 NOTE — Patient Instructions (Signed)
For the first 3 days, take 60 mg prednisone (3 tablets daily). On the next 3 days, take 40 mg prednisone (2 tablets daily). On the next 3 days, take 20 mg prednisone (1 tablet daily.)   Gout Gout is painful swelling that can occur in some of your joints. Gout is a type of arthritis. This condition is caused by having too much uric acid in your body. Uric acid is a chemical that forms when your body breaks down substances called purines. Purines are important for building body proteins. When your body has too much uric acid, sharp crystals can form and build up inside your joints. This causes pain and swelling. Gout attacks can happen quickly and be very painful (acute gout). Over time, the attacks can affect more joints and become more frequent (chronic gout). Gout can also cause uric acid to build up under your skin and inside your kidneys. What are the causes? This condition is caused by too much uric acid in your blood. This can occur because:  Your kidneys do not remove enough uric acid from your blood. This is the most common cause.  Your body makes too much uric acid. This can occur with some cancers and cancer treatments. It can also occur if your body is breaking down too many red blood cells (hemolytic anemia).  You eat too many foods that are high in purines. These foods include organ meats and some seafood. Alcohol, especially beer, is also high in purines.  A gout attack may be triggered by trauma or stress. What increases the risk? This condition is more likely to develop in people who:  Have a family history of gout.  Are male and middle-aged.  Are male and have gone through menopause.  Are obese.  Frequently drink alcohol, especially beer.  Are dehydrated.  Lose weight too quickly.  Have an organ transplant.  Have lead poisoning.  Take certain medicines, including aspirin, cyclosporine, diuretics, levodopa, and niacin.  Have kidney disease or  psoriasis.  What are the signs or symptoms? An attack of acute gout happens quickly. It usually occurs in just one joint. The most common place is the big toe. Attacks often start at night. Other joints that may be affected include joints of the feet, ankle, knee, fingers, wrist, or elbow. Symptoms may include:  Severe pain.  Warmth.  Swelling.  Stiffness.  Tenderness. The affected joint may be very painful to touch.  Shiny, red, or purple skin.  Chills and fever.  Chronic gout may cause symptoms more frequently. More joints may be involved. You may also have white or yellow lumps (tophi) on your hands or feet or in other areas near your joints. How is this diagnosed? This condition is diagnosed based on your symptoms, medical history, and physical exam. You may have tests, such as:  Blood tests to measure uric acid levels.  Removal of joint fluid with a needle (aspiration) to look for uric acid crystals.  X-rays to look for joint damage.  How is this treated? Treatment for this condition has two phases: treating an acute attack and preventing future attacks. Acute gout treatment may include medicines to reduce pain and swelling, including:  NSAIDs.  Steroids. These are strong anti-inflammatory medicines that can be taken by mouth (orally) or injected into a joint.  Colchicine. This medicine relieves pain and swelling when it is taken soon after an attack. It can be given orally or through an IV tube.  Preventive treatment may include:  Daily use of smaller doses of NSAIDs or colchicine.  Use of a medicine that reduces uric acid levels in your blood.  Changes to your diet. You may need to see a specialist about healthy eating (dietitian).  Follow these instructions at home: During a Gout Attack  If directed, apply ice to the affected area: ? Put ice in a plastic bag. ? Place a towel between your skin and the bag. ? Leave the ice on for 20 minutes, 2-3 times a  day.  Rest the joint as much as possible. If the affected joint is in your leg, you may be given crutches to use.  Raise (elevate) the affected joint above the level of your heart as often as possible.  Drink enough fluids to keep your urine clear or pale yellow.  Take over-the-counter and prescription medicines only as told by your health care provider.  Do not drive or operate heavy machinery while taking prescription pain medicine.  Follow instructions from your health care provider about eating or drinking restrictions.  Return to your normal activities as told by your health care provider. Ask your health care provider what activities are safe for you. Avoiding Future Gout Attacks  Follow a low-purine diet as told by your dietitian or health care provider. Avoid foods and drinks that are high in purines, including liver, kidney, anchovies, asparagus, herring, mushrooms, mussels, and beer.  Limit alcohol intake to no more than 1 drink a day for nonpregnant women and 2 drinks a day for men. One drink equals 12 oz of beer, 5 oz of wine, or 1 oz of hard liquor.  Maintain a healthy weight or lose weight if you are overweight. If you want to lose weight, talk with your health care provider. It is important that you do not lose weight too quickly.  Start or maintain an exercise program as told by your health care provider.  Drink enough fluids to keep your urine clear or pale yellow.  Take over-the-counter and prescription medicines only as told by your health care provider.  Keep all follow-up visits as told by your health care provider. This is important. Contact a health care provider if:  You have another gout attack.  You continue to have symptoms of a gout attack after10 days of treatment.  You have side effects from your medicines.  You have chills or a fever.  You have burning pain when you urinate.  You have pain in your lower back or belly. Get help right away  if:  You have severe or uncontrolled pain.  You cannot urinate. This information is not intended to replace advice given to you by your health care provider. Make sure you discuss any questions you have with your health care provider. Document Released: 12/08/2000 Document Revised: 05/18/2016 Document Reviewed: 09/23/2015 Elsevier Interactive Patient Education  2017 [employer].

## 2017-07-20 ENCOUNTER — Telehealth: Payer: Self-pay | Admitting: Physician Assistant

## 2017-07-20 NOTE — Telephone Encounter (Signed)
**  Remind patient they can make refill requests via MyChart**  Medication refill request (Name & Dosage):  predniSONE (DELTASONE) 20 MG tablet  Preferred pharmacy (Name & Address): [facility] [place], [place] AT [address] [phone] (Phone) [phone] (Fax)       Other comments (if applicable):

## 2017-07-20 NOTE — Telephone Encounter (Signed)
Discussed pt with Dr. Paulla Fore and he said no refill on Prednisone pt needs to be seen.

## 2017-07-20 NOTE — Telephone Encounter (Signed)
Tried to contact pt, number has been disconnected. Work number invalid also.

## 2017-07-20 NOTE — Telephone Encounter (Signed)
Called and spoke with pt informing him of Dr. Paulla Fore recommendation. Offered to schedule an appointment pt declines and states " I need to come in again." understanding verbalized nothing further needed at this time.

## 2017-07-20 NOTE — Telephone Encounter (Signed)
Updated pt contact info, please call to discuss gout treatment.  Thank you,  -LL

## 2017-10-01 ENCOUNTER — Encounter: Payer: Self-pay | Admitting: Physician Assistant

## 2017-10-01 ENCOUNTER — Ambulatory Visit (INDEPENDENT_AMBULATORY_CARE_PROVIDER_SITE_OTHER): Payer: Managed Care, Other (non HMO) | Admitting: Physician Assistant

## 2017-10-01 VITALS — BP 136/90 | HR 92 | Temp 99.1°F | Ht 71.5 in

## 2017-10-01 DIAGNOSIS — M109 Gout, unspecified: Secondary | ICD-10-CM | POA: Diagnosis not present

## 2017-10-01 LAB — BASIC METABOLIC PANEL
BUN: 16 mg/dL (ref 6–23)
CO2: 29 mEq/L (ref 19–32)
Calcium: 9.4 mg/dL (ref 8.4–10.5)
Chloride: 104 mEq/L (ref 96–112)
Creatinine, Ser: 1.24 mg/dL (ref 0.40–1.50)
GFR: 66.98 mL/min (ref >60.00)
Glucose, Bld: 91 mg/dL (ref 70–99)
Potassium: 4.2 mEq/L (ref 3.5–5.1)
Sodium: 141 mEq/L (ref 135–145)

## 2017-10-01 MED ORDER — PREDNISONE 20 MG PO TABS: 20.0000 mg | ORAL_TABLET | Freq: Every day | ORAL | 0 refills | Status: DC

## 2017-10-01 NOTE — Progress Notes (Signed)
Elijah King is a 46 y.o. male here for a gout flare.  I acted as a Education administrator for [employer], PA-C Anselmo Pickler, LPN  History of Present Illness:   Chief Complaint  Patient presents with  . Gout    Top of right foot, x 3 days    CC: Gout Flare   Elijah King is a 46 y.o. male who presents with possible gout. Pain is located in top of right foot, and has been present for 3 days. Pain is described as aching and burning and is constant. Associated symptoms: decreased range of motion. The patient has tried nothing for pain relief. Related to injury: no. Pain radiates up leg sometimes. He denies any known triggers -- dietary, ETOH, etc that may have caused this.   Last gout flare that required care was in July 2018. He was treated with prednisone and responded well.  He does have a history of CKD --> he has not had any labs checked since April. He has a solitary kidney. Was on indomethacin at one point for gout flares but this has resolved.  He has had to use crutches over the past few days. Denies fevers, streaking redness or discharge.   Past Medical History:  Diagnosis Date  . Chronic kidney disease      Social History   Social History  . Marital status: Married    Spouse name: N/A  . Number of children: N/A  . Years of education: N/A   Occupational History  . Not on file.   Social History Main Topics  . Smoking status: Current Every Day Smoker    Packs/day: 0.50    Types: Cigarettes    Start date: 03/03/1989  . Smokeless tobacco: Never Used  . Alcohol use 0.0 oz/week     Comment: twice weekly 5 drinks total  . Drug use: No  . Sexual activity: Yes   Other Topics Concern  . Not on file   Social History Narrative   Has a desk job   Live in [place]   Married, 1 child (son)    Past Surgical History:  Procedure Laterality Date  . KIDNEY CYST REMOVAL Left 1983    Family History  Problem Relation Age of Onset  . Anuerysm Father   . Colon cancer Neg Hx   .  Prostate cancer Neg Hx     No Known Allergies  Current Medications:   Current Outpatient Prescriptions:  .  ibuprofen (ADVIL) 200 MG tablet, Take 400 mg by mouth as needed., Disp: , Rfl:  .  predniSONE (DELTASONE) 20 MG tablet, Take 1 tablet (20 mg total) by mouth daily with breakfast. Take 3 tabs for 3 days, then 2 tabs for 3 days, then 1 tab for 3 days., Disp: 18 tablet, Rfl: 0   Review of Systems:   Review of Systems  All other systems reviewed and are negative.   Vitals:   Vitals:   10/01/17 1423  BP: 136/90  Pulse: 92  Temp: 99.1 F (37.3 C)  TempSrc: Oral  SpO2: 98%  Height: 5' 11.5" (1.816 m)     There is no height or weight on file to calculate BMI.  Physical Exam:   Physical Exam  Constitutional: He appears well-developed. He is cooperative.  Non-toxic appearance. He does not have a sickly appearance. He does not appear ill. No distress.  Cardiovascular: Normal rate, regular rhythm, S1 normal, S2 normal, normal heart sounds and normal pulses.   No LE  edema  Pulmonary/Chest: Effort normal and breath sounds normal.  Musculoskeletal:  Tenderness with light palpation to dorsum of mid-R foot. Slight decreased ROM with flexion and extension 2/2 pain. No obvious area of erythema or ecchymosis. No evidence of skin breakdown or swelling.  Neurological: He is alert. GCS eye subscore is 4. GCS verbal subscore is 5. GCS motor subscore is 6.  Skin: Skin is warm, dry and intact.  Psychiatric: He has a normal mood and affect. His speech is normal and behavior is normal.  Nursing note and vitals reviewed.   Assessment and Plan:    [name] was seen today for gout.  Diagnoses and all orders for this visit:  Acute gout of right foot, unspecified cause Will treat with prednisone per orders. Will also check BMP, consider indomethacin prescription prn to help with acute gout flares, as he has done in the past. Follow-up if symptoms worsen or persist. No red flags on exam. -      Basic metabolic panel  Other orders -     predniSONE (DELTASONE) 20 MG tablet; Take 1 tablet (20 mg total) by mouth daily with breakfast. Take 3 tabs for 3 days, then 2 tabs for 3 days, then 1 tab for 3 days.    . Reviewed expectations re: course of current medical issues. . Discussed self-management of symptoms. . Outlined signs and symptoms indicating need for more acute intervention. . Patient verbalized understanding and all questions were answered. . See orders for this visit as documented in the electronic medical record. . Patient received an After-Visit Summary.  CMA or LPN served as scribe during this visit. History, Physical, and Plan performed by medical provider. Documentation and orders reviewed and attested to.  [name], PA-C

## 2017-10-01 NOTE — Patient Instructions (Addendum)
Please return when you are feeling well so we can talk about medications for gout prevention and do some lab work.  We will call you with your lab results.

## 2017-10-02 ENCOUNTER — Other Ambulatory Visit: Payer: Self-pay | Admitting: Physician Assistant

## 2017-10-04 ENCOUNTER — Telehealth: Payer: Self-pay | Admitting: Physician Assistant

## 2017-10-04 NOTE — Telephone Encounter (Signed)
Patient called in reference to needing a note for work stating he was out of work 10/01/17 and 10/02/17.Please call patient and advise (after 330pm). OK to leave message.

## 2017-10-05 ENCOUNTER — Encounter: Payer: Self-pay | Admitting: *Deleted

## 2017-10-05 NOTE — Telephone Encounter (Signed)
Pt called back with fax number [phone]. Told him I will fax over right away. Pt verbalized understanding. Work excuse faxed.

## 2017-10-05 NOTE — Telephone Encounter (Signed)
Spoke to pt, told him I have his work note ready. Do you want to pickup or can I fax it somewhere for you? Pt said let me call you back with a fax number in a few minutes. Told pt okay that is fine.

## 2018-02-01 ENCOUNTER — Encounter: Payer: Self-pay | Admitting: Family Medicine

## 2018-02-01 ENCOUNTER — Ambulatory Visit (INDEPENDENT_AMBULATORY_CARE_PROVIDER_SITE_OTHER): Payer: Managed Care, Other (non HMO) | Admitting: Family Medicine

## 2018-02-01 VITALS — BP 132/78 | HR 86 | Temp 99.0°F | Ht 71.5 in | Wt 220.8 lb

## 2018-02-01 DIAGNOSIS — M109 Gout, unspecified: Secondary | ICD-10-CM

## 2018-02-01 DIAGNOSIS — M7022 Olecranon bursitis, left elbow: Secondary | ICD-10-CM

## 2018-02-01 DIAGNOSIS — M1000 Idiopathic gout, unspecified site: Secondary | ICD-10-CM

## 2018-02-01 MED ORDER — MELOXICAM 15 MG PO TABS: 15.0000 mg | ORAL_TABLET | Freq: Every day | ORAL | 0 refills | Status: DC

## 2018-02-01 MED ORDER — INDOMETHACIN 50 MG PO CAPS: 50.0000 mg | ORAL_CAPSULE | Freq: Three times a day (TID) | ORAL | 1 refills | Status: DC

## 2018-02-01 NOTE — Progress Notes (Signed)
    Subjective:  Elijah King is a 47 y.o. male who presents today for same-day appointment with a chief complaint of left elbow pain.   HPI:  Left Elbow Pain, Acute Issue Started a week ago. Worsened over that time. No falls or other obvious precipitating events. No skin lesions. Doesn't not feel like gout flare.  Associated with some elbow swelling.  No history of repetitive trauma to the elbow.  No history of prior trauma.  No other obvious alleviating or aggravating factors.  ROS: Per HPI  PMH: He reports that he quit smoking about 5 weeks ago. His smoking use included cigarettes. He started smoking about 28 years ago. He smoked 0.50 packs per day. he has never used smokeless tobacco. He reports that he drinks alcohol. He reports that he does not use drugs.  Objective:  Physical Exam: BP 132/78 (BP Location: Right Arm, Patient Position: Sitting, Cuff Size: Normal)   Pulse 86   Temp 99 F (37.2 C) (Oral)   Ht 5' 11.5" (1.816 m)   Wt 220 lb 12.8 oz (100.2 kg)   SpO2 96%   BMI 30.37 kg/m   Gen: NAD, resting comfortably MSK: -Left elbow: Effusion noted to posterior aspect of elbow.  Mild overlying erythema.  No areas of skin breakdown.  Full range of motion.  Strength 5 out of 5 in all directions of left elbow.  Distal radial pulse intact.  Sensation light touch intact distally.  Assessment/Plan:  Olecranon bursitis No signs of gout flare.  No signs of septic arthritis.  Start Mobic 15 mg once daily for the next 2 weeks.  Also discussed supportive measures including use of ice for 10-15 minutes at a time 3-4 times a day for the next couple of weeks.  Patient was placed in a Ace wrap during his office visit.  He was instructed to leave this in place as much as possible over the next couple of weeks.  Return precautions reviewed.  Follow-up as needed.  If not improving with above measures, would consider aspiration and intralesional steroid injection.  Gout No recent flares.  Patient  requested refill on indomethacin.  This was sent into his pharmacy.  Instructed patient that he should not take indomethacin while on meloxicam.  Gwendalyn Mcgonagle M. Jerline Pain, MD 02/01/2018 4:21 PM

## 2018-05-17 ENCOUNTER — Ambulatory Visit (INDEPENDENT_AMBULATORY_CARE_PROVIDER_SITE_OTHER): Payer: Self-pay | Admitting: Family Medicine

## 2018-05-17 ENCOUNTER — Encounter: Payer: Self-pay | Admitting: Family Medicine

## 2018-05-17 VITALS — BP 110/88 | HR 100 | Temp 98.7°F | Ht 71.5 in | Wt 213.6 lb

## 2018-05-17 DIAGNOSIS — Z905 Acquired absence of kidney: Secondary | ICD-10-CM

## 2018-05-17 DIAGNOSIS — K529 Noninfective gastroenteritis and colitis, unspecified: Secondary | ICD-10-CM

## 2018-05-17 MED ORDER — ONDANSETRON 4 MG PO TBDP: 4.0000 mg | ORAL_TABLET | Freq: Three times a day (TID) | ORAL | 0 refills | Status: DC | PRN

## 2018-05-17 NOTE — Patient Instructions (Signed)
Food Choices to Help Relieve Diarrhea, Adult  When you have diarrhea, the foods you eat and your eating habits are very important. Choosing the right foods and drinks can help:  · Relieve diarrhea.  · Replace lost fluids and nutrients.  · Prevent dehydration.    What general guidelines should I follow?  Relieving diarrhea  · Choose foods with less than 2 g or .07 oz. of fiber per serving.  · Limit fats to less than 8 tsp (38 g or 1.34 oz.) a day.  · Avoid the following:  ? Foods and beverages sweetened with high-fructose corn syrup, honey, or sugar alcohols such as xylitol, sorbitol, and mannitol.  ? Foods that contain a lot of fat or sugar.  ? Fried, greasy, or spicy foods.  ? High-fiber grains, breads, and cereals.  ? Raw fruits and vegetables.  · Eat foods that are rich in probiotics. These foods include dairy products such as yogurt and fermented milk products. They help increase healthy bacteria in the stomach and intestines (gastrointestinal tract, or GI tract).  · If you have lactose intolerance, avoid dairy products. These may make your diarrhea worse.  · Take medicine to help stop diarrhea (antidiarrheal medicine) only as told by your health care provider.  Replacing nutrients  · Eat small meals or snacks every 3–4 hours.  · Eat bland foods, such as white rice, toast, or baked potato, until your diarrhea starts to get better. Gradually reintroduce nutrient-rich foods as tolerated or as told by your health care provider. This includes:  ? Well-cooked protein foods.  ? Peeled, seeded, and soft-cooked fruits and vegetables.  ? Low-fat dairy products.  · Take vitamin and mineral supplements as told by your health care provider.  Preventing dehydration    · Start by sipping water or a special solution to prevent dehydration (oral rehydration solution, ORS). Urine that is clear or pale yellow means that you are getting enough fluid.  · Try to drink at least 8–10 cups of fluid each day to help replace lost  fluids.  · You may add other liquids in addition to water, such as clear juice or decaffeinated sports drinks, as tolerated or as told by your health care provider.  · Avoid drinks with caffeine, such as coffee, tea, or soft drinks.  · Avoid alcohol.  What foods are recommended?  The items listed may not be a complete list. Talk with your health care provider about what dietary choices are best for you.  Grains  White rice. White, French, or pita breads (fresh or toasted), including plain rolls, buns, or bagels. White pasta. Saltine, soda, or graham crackers. Pretzels. Low-fiber cereal. Cooked cereals made with water (such as cornmeal, farina, or cream cereals). Plain muffins. Matzo. Melba toast. Zwieback.  Vegetables  Potatoes (without the skin). Most well-cooked and canned vegetables without skins or seeds. Tender lettuce.  Fruits  Apple sauce. Fruits canned in juice. Cooked apricots, cherries, grapefruit, peaches, pears, or plums. Fresh bananas and cantaloupe.  Meats and other protein foods  Baked or boiled chicken. Eggs. Tofu. Fish. Seafood. Smooth nut butters. Ground or well-cooked tender beef, ham, veal, lamb, pork, or poultry.  Dairy  Plain yogurt, kefir, and unsweetened liquid yogurt. Lactose-free milk, buttermilk, skim milk, or soy milk. Low-fat or nonfat hard cheese.  Beverages  Water. Low-calorie sports drinks. Fruit juices without pulp. Strained tomato and vegetable juices. Decaffeinated teas. Sugar-free beverages not sweetened with sugar alcohols. Oral rehydration solutions, if approved by your health care   provider.  Seasoning and other foods  Bouillon, broth, or soups made from recommended foods.  What foods are not recommended?  The items listed may not be a complete list. Talk with your health care provider about what dietary choices are best for you.  Grains  Whole grain, whole wheat, bran, or rye breads, rolls, pastas, and crackers. Wild or brown rice. Whole grain or bran cereals. Barley. Oats  and oatmeal. Corn tortillas or taco shells. Granola. Popcorn.  Vegetables  Raw vegetables. Fried vegetables. Cabbage, broccoli, Brussels sprouts, artichokes, baked beans, beet greens, corn, kale, legumes, peas, sweet potatoes, and yams. Potato skins. Cooked spinach and cabbage.  Fruits  Dried fruit, including raisins and dates. Raw fruits. Stewed or dried prunes. Canned fruits with syrup.  Meat and other protein foods  Fried or fatty meats. Deli meats. Chunky nut butters. Nuts and seeds. Beans and lentils. Bacon. Hot dogs. Sausage.  Dairy  High-fat cheeses. Whole milk, chocolate milk, and beverages made with milk, such as milk shakes. Half-and-half. Cream. sour cream. Ice cream.  Beverages  Caffeinated beverages (such as coffee, tea, soda, or energy drinks). Alcoholic beverages. Fruit juices with pulp. Prune juice. Soft drinks sweetened with high-fructose corn syrup or sugar alcohols. High-calorie sports drinks.  Fats and oils  Butter. Cream sauces. Margarine. Salad oils. Plain salad dressings. Olives. Avocados. Mayonnaise.  Sweets and desserts  Sweet rolls, doughnuts, and sweet breads. Sugar-free desserts sweetened with sugar alcohols such as xylitol and sorbitol.  Seasoning and other foods  Honey. Hot sauce. Chili powder. Gravy. Cream-based or milk-based soups. Pancakes and waffles.  Summary  · When you have diarrhea, the foods you eat and your eating habits are very important.  · Make sure you get at least 8–10 cups of fluid each day, or enough to keep your urine clear or pale yellow.  · Eat bland foods and gradually reintroduce healthy, nutrient-rich foods as tolerated, or as told by your health care provider.  · Avoid high-fiber, fried, greasy, or spicy foods.  This information is not intended to replace advice given to you by your health care provider. Make sure you discuss any questions you have with your health care provider.  Document Released: 03/02/2004 Document Revised: 12/08/2016 Document  Reviewed: 12/08/2016  Elsevier Interactive Patient Education © 2018 [employer]

## 2018-05-17 NOTE — Progress Notes (Signed)
Elijah King is a 47 y.o. male here for an acute visit.  History of Present Illness:   Elijah King, CMA acting as scribe for Dr. Briscoe Deutscher.   GI Problem  The primary symptoms include nausea, vomiting and diarrhea. Primary symptoms comment: Patient having diarrhea 3-4 times a day. The illness began 3 to 5 days ago. The onset was sudden. The problem has not changed since onset. The illness is also significant for anorexia and bloating. The illness does not include chills.   Wife was sick with stomach bug over the weekend.Has same symptoms but only lasted for two days.  PMHx, SurgHx, SocialHx, Medications, and Allergies were reviewed in the Visit Navigator and updated as appropriate.  Current Medications:   Current Outpatient Medications:  .  indomethacin (INDOCIN) 50 MG capsule, Take 1 capsule (50 mg total) by mouth 3 (three) times daily with meals., Disp: 90 capsule, Rfl: 1 .  meloxicam (MOBIC) 15 MG tablet, Take 1 tablet (15 mg total) by mouth daily., Disp: 30 tablet, Rfl: 0   No Known Allergies   Review of Systems:   Pertinent items are noted in the HPI. Otherwise, ROS is negative.  Vitals:   Vitals:   05/17/18 1044  BP: 110/88  Pulse: 100  Temp: 98.7 F (37.1 C)  TempSrc: Oral  SpO2: 97%  Weight: 213 lb 9.6 oz (96.9 kg)  Height: 5' 11.5" (1.816 m)     Body mass index is 29.38 kg/m.  Physical Exam:   Physical Exam  Constitutional: He is oriented to person, place, and time. He appears well-developed and well-nourished. No distress.  HENT:  Head: Normocephalic and atraumatic.  Right Ear: External ear normal.  Left Ear: External ear normal.  Nose: Nose normal.  Mouth/Throat: Oropharynx is clear and moist.  Eyes: Pupils are equal, round, and reactive to light. Conjunctivae and EOM are normal.  Neck: Normal range of motion. Neck supple.  Cardiovascular: Normal rate, regular rhythm, normal heart sounds and intact distal pulses.  Pulmonary/Chest: Effort  normal and breath sounds normal.  Abdominal: Soft. Bowel sounds are normal. There is generalized tenderness.  Musculoskeletal: Normal range of motion.  Neurological: He is alert and oriented to person, place, and time.  Skin: Skin is warm and dry.  Psychiatric: He has a normal mood and affect. His behavior is normal. Judgment and thought content normal.  Nursing note and vitals reviewed.   Results for orders placed or performed in visit on 78/46/96  Basic metabolic panel  Result Value Ref Range   Sodium 141 135 - 145 mEq/L   Potassium 4.2 3.5 - 5.1 mEq/L   Chloride 104 96 - 112 mEq/L   CO2 29 19 - 32 mEq/L   Glucose, Bld 91 70 - 99 mg/dL   BUN 16 6 - 23 mg/dL   Creatinine, Ser 1.24 0.40 - 1.50 mg/dL   Calcium 9.4 8.4 - 10.5 mg/dL   GFR 66.98 >60.00 mL/min    Assessment and Plan:   Elijah King was seen today for diabetes.  Diagnoses and all orders for this visit:  Gastroenteritis -     Discontinue: ondansetron (ZOFRAN ODT) 4 MG disintegrating tablet; Take 1 tablet (4 mg total) by mouth every 8 (eight) hours as needed for nausea or vomiting. -     ondansetron (ZOFRAN ODT) 4 MG disintegrating tablet; Take 1 tablet (4 mg total) by mouth every 8 (eight) hours as needed for nausea or vomiting.  Solitary kidney, acquired Comments: Reviewed the importance of  hydration.     . Reviewed expectations re: course of current medical issues. . Discussed self-management of symptoms. . Outlined signs and symptoms indicating need for more acute intervention. . Patient verbalized understanding and all questions were answered. Marland Kitchen Health Maintenance issues including appropriate healthy diet, exercise, and smoking avoidance were discussed with patient. . See orders for this visit as documented in the electronic medical record. . Patient received an After Visit Summary.  CMA served as Education administrator during this visit. History, Physical, and Plan performed by medical provider. The above documentation has  been reviewed and is accurate and complete. Briscoe Deutscher, D.O.   Briscoe Deutscher, DO [name], [name] 05/18/2018

## 2018-09-13 ENCOUNTER — Encounter: Payer: Self-pay | Admitting: Physician Assistant

## 2018-09-13 ENCOUNTER — Ambulatory Visit (INDEPENDENT_AMBULATORY_CARE_PROVIDER_SITE_OTHER): Payer: Commercial Managed Care - PPO | Admitting: Physician Assistant

## 2018-09-13 VITALS — BP 130/90 | HR 80 | Temp 99.1°F | Ht 71.5 in | Wt 221.2 lb

## 2018-09-13 DIAGNOSIS — M1000 Idiopathic gout, unspecified site: Secondary | ICD-10-CM

## 2018-09-13 DIAGNOSIS — Z23 Encounter for immunization: Secondary | ICD-10-CM

## 2018-09-13 DIAGNOSIS — M109 Gout, unspecified: Secondary | ICD-10-CM

## 2018-09-13 DIAGNOSIS — Z136 Encounter for screening for cardiovascular disorders: Secondary | ICD-10-CM

## 2018-09-13 DIAGNOSIS — Z0000 Encounter for general adult medical examination without abnormal findings: Secondary | ICD-10-CM | POA: Diagnosis not present

## 2018-09-13 DIAGNOSIS — Z13220 Encounter for screening for lipoid disorders: Secondary | ICD-10-CM | POA: Diagnosis not present

## 2018-09-13 DIAGNOSIS — R030 Elevated blood-pressure reading, without diagnosis of hypertension: Secondary | ICD-10-CM

## 2018-09-13 DIAGNOSIS — E669 Obesity, unspecified: Secondary | ICD-10-CM

## 2018-09-13 DIAGNOSIS — N189 Chronic kidney disease, unspecified: Secondary | ICD-10-CM

## 2018-09-13 MED ORDER — INDOMETHACIN 50 MG PO CAPS: 50.0000 mg | ORAL_CAPSULE | Freq: Three times a day (TID) | ORAL | 1 refills | Status: DC

## 2018-09-13 NOTE — Patient Instructions (Addendum)
It was great to see you!  Please go to the lab for blood work.   Our office will call you with your results unless you have chosen to receive results via MyChart.  If your blood work is normal we will follow-up each year for physicals and as scheduled for chronic medical problems.  If anything is abnormal we will treat accordingly and get you in for a follow-up.  Take care,  [facility] Maintenance, Male A healthy lifestyle and preventive care is important for your health and wellness. Ask your health care provider about what schedule of regular examinations is right for you. What should I know about weight and diet? Eat a Healthy Diet  Eat plenty of vegetables, fruits, whole grains, low-fat dairy products, and lean protein.  Do not eat a lot of foods high in solid fats, added sugars, or salt.  Maintain a Healthy Weight Regular exercise can help you achieve or maintain a healthy weight. You should:  Do at least 150 minutes of exercise each week. The exercise should increase your heart rate and make you sweat (moderate-intensity exercise).  Do strength-training exercises at least twice a week.  Watch Your Levels of Cholesterol and Blood Lipids  Have your blood tested for lipids and cholesterol every 5 years starting at 46 years of age. If you are at high risk for heart disease, you should start having your blood tested when you are 46 years old. You may need to have your cholesterol levels checked more often if: ? Your lipid or cholesterol levels are high. ? You are older than 46 years of age. ? You are at high risk for heart disease.  What should I know about cancer screening? Many types of cancers can be detected early and may often be prevented. Lung Cancer  You should be screened every year for lung cancer if: ? You are a current smoker who has smoked for at least 30 years. ? You are a former smoker who has quit within the past 15 years.  Talk to your health  care provider about your screening options, when you should start screening, and how often you should be screened.  Colorectal Cancer  Routine colorectal cancer screening usually begins at 46 years of age and should be repeated every 5-10 years until you are 46 years old. You may need to be screened more often if early forms of precancerous polyps or small growths are found. Your health care provider may recommend screening at an earlier age if you have risk factors for colon cancer.  Your health care provider may recommend using home test kits to check for hidden blood in the stool.  A small camera at the end of a tube can be used to examine your colon (sigmoidoscopy or colonoscopy). This checks for the earliest forms of colorectal cancer.  Prostate and Testicular Cancer  Depending on your age and overall health, your health care provider may do certain tests to screen for prostate and testicular cancer.  Talk to your health care provider about any symptoms or concerns you have about testicular or prostate cancer.  Skin Cancer  Check your skin from head to toe regularly.  Tell your health care provider about any new moles or changes in moles, especially if: ? There is a change in a mole's size, shape, or color. ? You have a mole that is larger than a pencil eraser.  Always use sunscreen. Apply sunscreen liberally and repeat throughout the day.  Protect yourself by wearing long sleeves, pants, a wide-brimmed hat, and sunglasses when outside.  What should I know about heart disease, diabetes, and high blood pressure?  If you are 42-80 years of age, have your blood pressure checked every 3-5 years. If you are 20 years of age or older, have your blood pressure checked every year. You should have your blood pressure measured twice-once when you are at a hospital or clinic, and once when you are not at a hospital or clinic. Record the average of the two measurements. To check your blood  pressure when you are not at a hospital or clinic, you can use: ? An automated blood pressure machine at a pharmacy. ? A home blood pressure monitor.  Talk to your health care provider about your target blood pressure.  If you are between 90-72 years old, ask your health care provider if you should take aspirin to prevent heart disease.  Have regular diabetes screenings by checking your fasting blood sugar level. ? If you are at a normal weight and have a low risk for diabetes, have this test once every three years after the age of 18. ? If you are overweight and have a high risk for diabetes, consider being tested at a younger age or more often.  A one-time screening for abdominal aortic aneurysm (AAA) by ultrasound is recommended for men aged 7-75 years who are current or former smokers. What should I know about preventing infection? Hepatitis B If you have a higher risk for hepatitis B, you should be screened for this virus. Talk with your health care provider to find out if you are at risk for hepatitis B infection. Hepatitis C Blood testing is recommended for:  Everyone born from [birth year] through 1964.  Anyone with known risk factors for hepatitis C.  Sexually Transmitted Diseases (STDs)  You should be screened each year for STDs including gonorrhea and chlamydia if: ? You are sexually active and are younger than 46 years of age. ? You are older than 46 years of age and your health care provider tells you that you are at risk for this type of infection. ? Your sexual activity has changed since you were last screened and you are at an increased risk for chlamydia or gonorrhea. Ask your health care provider if you are at risk.  Talk with your health care provider about whether you are at high risk of being infected with HIV. Your health care provider may recommend a prescription medicine to help prevent HIV infection.  What else can I do?  Schedule regular health, dental, and eye  exams.  Stay current with your vaccines (immunizations).  Do not use any tobacco products, such as cigarettes, chewing tobacco, and e-cigarettes. If you need help quitting, ask your health care provider.  Limit alcohol intake to no more than 2 drinks per day. One drink equals 12 ounces of beer, 5 ounces of wine, or 1 ounces of hard liquor.  Do not use street drugs.  Do not share needles.  Ask your health care provider for help if you need support or information about quitting drugs.  Tell your health care provider if you often feel depressed.  Tell your health care provider if you have ever been abused or do not feel safe at home. This information is not intended to replace advice given to you by your health care provider. Make sure you discuss any questions you have with your health care provider. Document Released: 06/08/2008 Document  Revised: 08/09/2016 Document Reviewed: 09/14/2015 Elsevier Interactive Patient Education  [employer].

## 2018-09-13 NOTE — Progress Notes (Signed)
I acted as a Education administrator for [employer], PA-C Anselmo Pickler, LPN   Subjective:    Elijah King is a 47 y.o. male and is here for a comprehensive physical exam.  HPI  Health Maintenance Due  Topic Date Due  . INFLUENZA VACCINE  07/25/2018    Acute Concerns: None  Chronic Issues: Gout -- 7 months ago was last flare, takes indomethacin on regular basis and this helps him Tobacco abuse -- quit smoking in January 2019!! Elevated blood pressure -- History of this. Currently not on any medications. At home blood pressure readings are not measured. Patient denies chest pain, SOB, blurred vision, dizziness, unusual headaches, lower leg swelling. Denies excessive caffeine intake, stimulant usage, excessive alcohol intake, or increase in salt consumption. Was on Lisinopril 10 mg briefly. BP Readings from Last 3 Encounters:  09/13/18 130/90  05/17/18 110/88  02/01/18 132/78  Obesity -- patient has a sedentary lifestyle, does not often eat healthy foods. No exercise currently. GERD -- takes prn prilosec CKD -- hx of most recent BUN and Cre WNL Lab Results  Component Value Date   CREATININE 1.24 10/01/2017   CREATININE 1.19 04/11/2017   CREATININE 1.25 05/13/2016   Lab Results  Component Value Date   BUN 16 10/01/2017     Health Maintenance: Immunizations -- flu shot today Colonoscopy -- no family history PSA -- no family history Diet -- variable diet Sleep habits -- sleep is okay Exercise -- none Weight -- Weight: 221 lb 4 oz (100.4 kg)  Weight history Wt Readings from Last 10 Encounters:  09/13/18 221 lb 4 oz (100.4 kg)  05/17/18 213 lb 9.6 oz (96.9 kg)  02/01/18 220 lb 12.8 oz (100.2 kg)  07/09/17 214 lb (97.1 kg)  03/28/17 218 lb 8 oz (99.1 kg)  08/26/16 216 lb (98 kg)  05/13/16 213 lb (96.6 kg)  11/28/15 217 lb (98.4 kg)  05/27/15 225 lb (102.1 kg)  03/04/15 226 lb (102.5 kg)  Mood -- good Tobacco use -- quit in Jan Alcohol use --- "no significant  intake"  Depression screen PHQ 2/9 09/13/2018  Decreased Interest 0  Down, Depressed, Hopeless 0  PHQ - 2 Score 0   Other providers/specialists: Dentist and Eye doctor UTD  PMHx, SurgHx, SocialHx, Medications, and Allergies were reviewed in the Visit Navigator and updated as appropriate.   Past Medical History:  Diagnosis Date  . Chronic kidney disease      Past Surgical History:  Procedure Laterality Date  . KIDNEY CYST REMOVAL Left 1983     Family History  Problem Relation Age of Onset  . Anuerysm Father   . Colon cancer Neg Hx   . Prostate cancer Neg Hx     Social History   Tobacco Use  . Smoking status: Former Smoker    Packs/day: 0.50    Types: Cigarettes    Start date: 03/03/1989    Last attempt to quit: 12/25/2017    Years since quitting: 0.7  . Smokeless tobacco: Never Used  Substance Use Topics  . Alcohol use: Yes    Alcohol/week: 0.0 standard drinks    Comment: twice weekly 5 drinks total  . Drug use: No    Review of Systems:   Review of Systems  Constitutional: Negative.  Negative for chills, fever, malaise/fatigue and weight loss.  HENT: Negative.  Negative for hearing loss, sinus pain and sore throat.   Eyes: Negative.  Negative for blurred vision.  Respiratory: Negative.  Negative for cough and  shortness of breath.   Cardiovascular: Negative.  Negative for chest pain, palpitations and leg swelling.  Gastrointestinal: Negative.  Negative for abdominal pain, constipation, diarrhea, heartburn, nausea and vomiting.  Genitourinary: Negative.  Negative for dysuria, frequency and urgency.  Musculoskeletal: Negative.  Negative for back pain, myalgias and neck pain.  Skin: Negative.  Negative for itching and rash.  Neurological: Negative.  Negative for dizziness, tingling, seizures, loss of consciousness and headaches.  Endo/Heme/Allergies: Negative for polydipsia.  Psychiatric/Behavioral: Negative.  Negative for depression. The patient is not  nervous/anxious.     Objective:   Vitals:   09/13/18 1512  BP: 130/90  Pulse: 80  Temp: 99.1 F (37.3 C)  SpO2: 98%   Body mass index is 30.43 kg/m.  General Appearance:  Alert, cooperative, no distress, appears stated age  Head:  Normocephalic, without obvious abnormality, atraumatic  Eyes:  PERRL, conjunctiva/corneas clear, EOM's intact, fundi benign, both eyes       Ears:  Normal TM's and external ear canals, both ears  Nose: Nares normal, septum midline, mucosa normal, no drainage    or sinus tenderness  Throat: Lips, mucosa, and tongue normal; teeth and gums normal  Neck: Supple, symmetrical, trachea midline, no adenopathy; thyroid:  No enlargement/tenderness/nodules; no carotit bruit or JVD  Back:   Symmetric, no curvature, ROM normal, no CVA tenderness  Lungs:   Clear to auscultation bilaterally, respirations unlabored  Chest wall:  No tenderness or deformity  Heart:  Regular rate and rhythm, S1 and S2 normal, no murmur, rub   or gallop  Abdomen:   Soft, non-tender, bowel sounds active all four quadrants, no masses, no organomegaly  Extremities: Extremities normal, atraumatic, no cyanosis or edema  Prostate: Not done.   Skin: Skin color, texture, turgor normal, no rashes or lesions  Lymph nodes: Cervical, supraclavicular, and axillary nodes normal  Neurologic: CNII-XII grossly intact. Normal strength, sensation and reflexes throughout    Assessment/Plan:   Elijah King was seen today for annual exam.  Diagnoses and all orders for this visit:  Routine physical examination Today patient counseled on age appropriate routine health concerns for screening and prevention, each reviewed and up to date or declined. Immunizations reviewed and up to date or declined. Labs ordered and reviewed. Risk factors for depression reviewed and negative. Hearing function and visual acuity are intact. ADLs screened and addressed as needed. Functional ability and level of safety reviewed and  appropriate. Education, counseling and referrals performed based on assessed risks today. Patient provided with a copy of personalized plan for preventive services.  Need for prophylactic vaccination and inoculation against influenza -     Flu Vaccine QUAD 36+ mos IM  Elevated blood pressure reading BP log given. Currently without symptoms. Work on [employer] and try to incorporate exercise into lifestyle as able. Possibly underlying OSA, but never had sleep study performed. Will continue to encourage at follow-up. -     CBC with Differential/Platelet -     Comprehensive metabolic panel  Obesity, unspecified classification, unspecified obesity type, unspecified whether serious comorbidity present Continue to work on diet. Start exercising. Discussed. -     CBC with Differential/Platelet -     Comprehensive metabolic panel  Encounter for lipid screening for cardiovascular disease -     Cancel: Lipid panel -     Lipid panel  Gout Refill indomethacin. Currently well controlled.  CKD Currently controlled. Will re-check renal function today.  Other orders -     indomethacin (INDOCIN) 50 MG capsule;  Take 1 capsule (50 mg total) by mouth 3 (three) times daily with meals.    Well Adult Exam: Labs ordered: Yes. Patient counseling was done. See below for items discussed. Discussed the patient's BMI.  The BMI BMI is not in the acceptable range; BMI management plan is completed Follow up in 6 months.  Patient Counseling: [x]   Nutrition: Stressed importance of moderation in sodium/caffeine intake, saturated fat and cholesterol, caloric balance, sufficient intake of fresh fruits, vegetables, and fiber.  [x]   Stressed the importance of regular exercise.   []   Substance Abuse: Discussed cessation/primary prevention of tobacco, alcohol, or other drug use; driving or other dangerous activities under the influence; availability of treatment for abuse.   [x]   Injury prevention: Discussed safety  belts, safety helmets, smoke detector, smoking near bedding or upholstery.   []   Sexuality: Discussed sexually transmitted diseases, partner selection, use of condoms, avoidance of unintended pregnancy  and contraceptive alternatives.   [x]   Dental health: Discussed importance of regular tooth brushing, flossing, and dental visits.  [x]   Health maintenance and immunizations reviewed. Please refer to Health maintenance section.     [name], PA-C [place]

## 2018-09-14 LAB — COMPREHENSIVE METABOLIC PANEL
AG Ratio: 1.8 (calc) (ref 1.0–2.5)
ALT: 37 U/L (ref 9–46)
AST: 25 U/L (ref 10–40)
Albumin: 4.6 g/dL (ref 3.6–5.1)
Alkaline phosphatase (APISO): 71 U/L (ref 40–115)
BUN: 16 mg/dL (ref 7–25)
CO2: 27 mmol/L (ref 20–32)
Calcium: 9.7 mg/dL (ref 8.6–10.3)
Chloride: 103 mmol/L (ref 98–110)
Creat: 1.28 mg/dL (ref 0.60–1.35)
Globulin: 2.5 g/dL (calc) (ref 1.9–3.7)
Glucose, Bld: 80 mg/dL (ref 65–99)
Potassium: 4.6 mmol/L (ref 3.5–5.3)
Sodium: 139 mmol/L (ref 135–146)
Total Bilirubin: 0.3 mg/dL (ref 0.2–1.2)
Total Protein: 7.1 g/dL (ref 6.1–8.1)

## 2018-09-14 LAB — CBC WITH DIFFERENTIAL/PLATELET
Basophils Absolute: 53 cells/uL (ref 0–200)
Basophils Relative: 0.8 %
Eosinophils Absolute: 211 cells/uL (ref 15–500)
Eosinophils Relative: 3.2 %
HCT: 42 % (ref 38.5–50.0)
Hemoglobin: 14.4 g/dL (ref 13.2–17.1)
Lymphs Abs: 1861 cells/uL (ref 850–3900)
MCH: 30.6 pg (ref 27.0–33.0)
MCHC: 34.3 g/dL (ref 32.0–36.0)
MCV: 89.4 fL (ref 80.0–100.0)
MPV: 11 fL (ref 7.5–12.5)
Monocytes Relative: 9.7 %
Neutro Abs: 3835 cells/uL (ref 1500–7800)
Neutrophils Relative %: 58.1 %
Platelets: 198 10*3/uL (ref 140–400)
RBC: 4.7 10*6/uL (ref 4.20–5.80)
RDW: 13.2 % (ref 11.0–15.0)
Total Lymphocyte: 28.2 %
WBC mixed population: 640 cells/uL (ref 200–950)
WBC: 6.6 10*3/uL (ref 3.8–10.8)

## 2018-09-14 LAB — LIPID PANEL
Cholesterol: 190 mg/dL (ref <200)
HDL: 48 mg/dL (ref >40)
LDL Cholesterol (Calc): 113 mg/dL (calc) — ABNORMAL HIGH
Non-HDL Cholesterol (Calc): 142 mg/dL (calc) — ABNORMAL HIGH (ref <130)
Total CHOL/HDL Ratio: 4 (calc) (ref <5.0)
Triglycerides: 176 mg/dL — ABNORMAL HIGH (ref <150)

## 2018-11-04 ENCOUNTER — Ambulatory Visit (INDEPENDENT_AMBULATORY_CARE_PROVIDER_SITE_OTHER): Payer: Commercial Managed Care - PPO

## 2018-11-04 ENCOUNTER — Encounter: Payer: Self-pay | Admitting: Physician Assistant

## 2018-11-04 ENCOUNTER — Ambulatory Visit (INDEPENDENT_AMBULATORY_CARE_PROVIDER_SITE_OTHER): Payer: Commercial Managed Care - PPO | Admitting: Physician Assistant

## 2018-11-04 VITALS — BP 120/82 | HR 81 | Temp 98.7°F | Ht 71.5 in | Wt 221.2 lb

## 2018-11-04 DIAGNOSIS — M25561 Pain in right knee: Secondary | ICD-10-CM

## 2018-11-04 DIAGNOSIS — M25461 Effusion, right knee: Secondary | ICD-10-CM | POA: Diagnosis not present

## 2018-11-04 DIAGNOSIS — L853 Xerosis cutis: Secondary | ICD-10-CM

## 2018-11-04 MED ORDER — PREDNISONE 20 MG PO TABS: ORAL_TABLET | ORAL | 0 refills | Status: DC

## 2018-11-04 MED ORDER — TRIAMCINOLONE ACETONIDE 0.5 % EX OINT: TOPICAL_OINTMENT | CUTANEOUS | 0 refills | Status: DC

## 2018-11-04 NOTE — Progress Notes (Addendum)
AMEET SANDY is a 47 y.o. male here for a new problem.  I acted as a Education administrator for [employer], PA-C Anselmo Pickler, LPN  History of Present Illness:   Chief Complaint  Patient presents with  . Knee Pain    Right    Knee Pain   Incident onset: Started 2 weeks ago. The incident occurred at home. There was no injury mechanism. The pain is present in the right knee. The quality of the pain is described as aching and stabbing. The pain is at a severity of 7/10. The pain is moderate. The pain has been fluctuating since onset. Associated symptoms include an inability to bear weight and muscle weakness. He reports no foreign bodies present. The symptoms are aggravated by movement and weight bearing. Treatments tried: Indomethicin. The treatment provided no relief.   Doesn't feel like this is his typically gout flare -- not as severe however it has been going on for 3 weeks. He has not tried compression, elevation or ice.  He wears steel-toed boots on a regular basis, states he walks on concrete all day.  Per chart review, he had a very similar episode in Nov 2015.  He also would like to discuss the dryness on his R palm. He has noticed for several months to a year, that he has worsening dry skin that cracks at times only at his R palm. He denies any known triggers. He has tried vaseline, neosporin and lotion without relief. Can sometimes be painful when cracked.   Past Medical History:  Diagnosis Date  . Chronic kidney disease      Social History   Socioeconomic History  . Marital status: Married    Spouse name: Not on file  . Number of children: Not on file  . Years of education: Not on file  . Highest education level: Not on file  Occupational History  . Not on file  Social Needs  . Financial resource strain: Not on file  . Food insecurity:    Worry: Not on file    Inability: Not on file  . Transportation needs:    Medical: Not on file    Non-medical: Not on file  Tobacco  Use  . Smoking status: Former Smoker    Packs/day: 0.50    Types: Cigarettes    Start date: 03/03/1989    Last attempt to quit: 12/25/2017    Years since quitting: 0.8  . Smokeless tobacco: Never Used  Substance and Sexual Activity  . Alcohol use: Yes    Alcohol/week: 0.0 standard drinks    Comment: twice weekly 5 drinks total  . Drug use: No  . Sexual activity: Yes  Lifestyle  . Physical activity:    Days per week: Not on file    Minutes per session: Not on file  . Stress: Not on file  Relationships  . Social connections:    Talks on phone: Not on file    Gets together: Not on file    Attends religious service: Not on file    Active member of club or organization: Not on file    Attends meetings of clubs or organizations: Not on file    Relationship status: Not on file  . Intimate partner violence:    Fear of current or ex partner: Not on file    Emotionally abused: Not on file    Physically abused: Not on file    Forced sexual activity: Not on file  Other Topics Concern  .  Not on file  Social History Narrative   Has a desk job   Live in [place]   Married, 1 child (son)    Past Surgical History:  Procedure Laterality Date  . KIDNEY CYST REMOVAL Left 1983    Family History  Problem Relation Age of Onset  . Anuerysm Father   . Colon cancer Neg Hx   . Prostate cancer Neg Hx     No Known Allergies  Current Medications:   Current Outpatient Medications:  .  indomethacin (INDOCIN) 50 MG capsule, Take 1 capsule (50 mg total) by mouth 3 (three) times daily with meals., Disp: 90 capsule, Rfl: 1 .  predniSONE (DELTASONE) 20 MG tablet, Take 3 tabs daily x 3 days, then 2 tabs daily x 3 days, then 1 tab daily x 3 days., Disp: 18 tablet, Rfl: 0 .  triamcinolone ointment (KENALOG) 0.5 %, Apply to affected area of skin 1-2 x daily, Disp: 30 g, Rfl: 0   Review of Systems:   ROS Negative unless otherwise specified per HPI.  Vitals:   Vitals:   11/04/18 1003  BP: 120/82   Pulse: 81  Temp: 98.7 F (37.1 C)  TempSrc: Oral  Weight: 221 lb 4 oz (100.4 kg)  Height: 5' 11.5" (1.816 m)     Body mass index is 30.43 kg/m.  Physical Exam:   Physical Exam  Constitutional: He appears well-developed and well-nourished.  HENT:  Head: Normocephalic.  Eyes: Pupils are equal, round, and reactive to light. Conjunctivae and EOM are normal.  Neck: Normal range of motion.  Cardiovascular:  Pulses:      Dorsalis pedis pulses are 2+ on the right side, and 2+ on the left side.       Posterior tibial pulses are 2+ on the right side, and 2+ on the left side.  Pulmonary/Chest: Effort normal.  Musculoskeletal:  Knee exam: Mild swelling and point tenderness to suprapatellar area of R knee. Pain with resisted extension. FROM in flex/extension without crepitus. No popliteal fullness. Neg drawer test. No abnormal patellar mobility.  L knee without abnormalities.  R calf without tenderness or swelling  Skin: Skin is warm and dry.  R palm of hand with centralized scaling and fissuring of skin. No drainage or erythema.  Psychiatric: He has a normal mood and affect. His behavior is normal. Judgment and thought content normal.    CLINICAL DATA:  Acute pain of right knee.  EXAM: RIGHT KNEE - COMPLETE 4+ VIEW  COMPARISON:  11/16/2014  FINDINGS: Moderate to large joint effusion. No acute bony abnormality. Specifically, no fracture, subluxation, or dislocation. Joint spaces maintained.  AP view of the left knee is included for comparison. There is a sclerotic area within the distal left femoral metaphyseal region.  IMPRESSION: Moderate to large joint effusion within the right knee. No acute bony abnormality within the right knee.  Rounded sclerotic area within the distal left femoral metaphysis. Recommend further evaluation with dedicated full left knee series.   Electronically Signed   By: Rolm Baptise M.D.   On: 11/04/2018 10:35   Assessment and  Plan:    Mann was seen today for knee pain.  Diagnoses and all orders for this visit:  Acute pain of right knee Reviewed briefly with Dr. Paulla Fore. Suspect possible synovitis. Will treat with oral prednisone and have close follow-up with Dr. Paulla Fore. X-ray confirms effusion -- further work-up per Paulla Fore. Appointment scheduled for 1 week. Return precautions given.  -     DG Knee  Complete 4 Views Right; Future -     Ambulatory referral to Sports Medicine  Dry skin Unclear etiology. Will trial round of triamcinolone ointment. Will consider dermatology referral if needed.  Other orders -     predniSONE (DELTASONE) 20 MG tablet; Take 3 tabs daily x 3 days, then 2 tabs daily x 3 days, then 1 tab daily x 3 days. -     triamcinolone ointment (KENALOG) 0.5 %; Apply to affected area of skin 1-2 x daily  . Reviewed expectations re: course of current medical issues. . Discussed self-management of symptoms. . Outlined signs and symptoms indicating need for more acute intervention. . Patient verbalized understanding and all questions were answered. . See orders for this visit as documented in the electronic medical record. . Patient received an After-Visit Summary.  CMA or LPN served as scribe during this visit. History, Physical, and Plan performed by medical provider. The above documentation has been reviewed and is accurate and complete.  [name], PA-C

## 2018-11-04 NOTE — Patient Instructions (Addendum)
It was great to see you!  Start the oral steroid (prednisone) today for your knee pain. Please make an appointment to see Dr. Paulla Fore in 1 week (sooner if symptoms persist or do not improve with treatment). Please wrap your knee with ACE bandage to help with your symptoms.  You may use the triamcinolone ointment for your palm.  Take care,  [name] PA-C   Contact a health care provider if:  Your knee pain continues, changes, or gets worse.  You have a fever along with knee pain.  Your knee buckles or locks up.  Your knee swells, and the swelling becomes worse. Get help right away if:  Your knee feels warm to the touch.  You cannot move your knee.  You have severe pain in your knee.  You have chest pain.  You have trouble breathing. Summary  Knee pain in adults is common. It can be caused by many things, including, arthritis, infection, cysts, or injury.  Knee pain is usually not a sign of a serious problem, but if it does not go away, a health care provider may perform tests to know the cause of the pain.  Pay attention to any changes in your symptoms. Relieve your pain with rest, medicines, light activity, and use of ice.  Get help if your pain continues or becomes very severe, or if your knee buckles or locks up, or if you have chest pain or trouble breathing. This information is not intended to replace advice given to you by your health care provider. Make sure you discuss any questions you have with your health care provider.

## 2018-11-11 ENCOUNTER — Ambulatory Visit: Payer: Commercial Managed Care - PPO | Admitting: Sports Medicine

## 2019-10-20 ENCOUNTER — Ambulatory Visit: Payer: Self-pay | Admitting: Family Medicine

## 2019-10-20 ENCOUNTER — Encounter: Payer: Self-pay | Admitting: Family Medicine

## 2019-10-20 ENCOUNTER — Other Ambulatory Visit: Payer: Self-pay

## 2019-10-20 VITALS — BP 128/84 | HR 117 | Temp 98.1°F | Resp 18

## 2019-10-20 DIAGNOSIS — M109 Gout, unspecified: Secondary | ICD-10-CM

## 2019-10-20 DIAGNOSIS — M1000 Idiopathic gout, unspecified site: Secondary | ICD-10-CM

## 2019-10-20 MED ORDER — PREDNISONE 10 MG PO TABS: ORAL_TABLET | ORAL | 0 refills | Status: DC

## 2019-10-20 MED ORDER — HYDROCODONE-ACETAMINOPHEN 5-325 MG PO TABS: 1.0000 | ORAL_TABLET | Freq: Four times a day (QID) | ORAL | 0 refills | Status: DC | PRN

## 2019-10-20 NOTE — Progress Notes (Signed)
   Subjective  CC:  Chief Complaint  Patient presents with  . Gout    Started 10/22, right foot.. Taking Indomethacin     HPI: Elijah King is a 48 y.o. male who presents to the office today to address the problems listed above in the chief complaint.  48 yo with h/o gout with acute painful flare in right foot x 4 days; describes pain as severe. Indocin not helping. Not sleeping due to pain. In mid foot and ankle. Swelling is present. No redness, f/c/s or other affected joints. Hasn't had recent flare. In past, pred has helped. Last creat and uric acid were normal.    Assessment  1. Polyarticular gout      Plan   Gout, acute:  pred taper; norco until improving. May restart indocin if needed when finished taper.   Follow up: Return for complete physical with Len Blalock, PA.  Visit date not found  No orders of the defined types were placed in this encounter.  No orders of the defined types were placed in this encounter.     I reviewed the patients updated PMH, FH, and SocHx.    Patient Active Problem List   Diagnosis Date Noted  . Elevated blood pressure reading 03/28/2017  . Snoring 03/28/2017  . 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 Chronic kidney disease 03/28/2017  . Former smoker 03/28/2017  . Obesity (BMI 30-39.9) 03/28/2017  . Gastroesophageal reflux disease 03/28/2017  . 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 indomethacin (INDOCIN) 50 MG capsule Take 1 capsule (50 mg total) by mouth 3 (three) times daily with meals.    Allergies: Patient has No Known Allergies. Family History: Patient family history includes Anuerysm in his father. Social History:  Patient  reports that he quit smoking about 21 months ago. His smoking use included cigarettes. He started smoking about 30 years ago. He smoked 0.50 packs per day. He has never used smokeless tobacco. He reports current alcohol use. He reports that he does not use drugs.  Review of  Systems: Constitutional: Negative for fever malaise or anorexia Cardiovascular: negative for chest pain Respiratory: negative for SOB or persistent cough Gastrointestinal: negative for abdominal pain  Objective  Vitals: Ht 5' 11.5" (1.816 m)   BMI 30.43 kg/m  General: appears uncomfortable. Using crutches to ambulate , A&Ox3 Cardiovascular:  RRR without murmur or gallop.  Respiratory:  Good breath sounds bilaterally, CTAB with normal respiratory effort Skin:  Warm, no rashes Right foot: mid foot swelling with minimal redness, no warmth. Very tender.     Commons side effects, risks, benefits, and alternatives for medications and treatment plan prescribed today were discussed, and the patient expressed understanding of the given instructions. Patient is instructed to call or message via MyChart if he/she has any questions or concerns regarding our treatment plan. No barriers to understanding were identified. We discussed Red Flag symptoms and signs in detail. Patient expressed understanding regarding what to do in case of urgent or emergency type symptoms.   Medication list was reconciled, printed and provided to the patient in AVS. Patient instructions and summary information was reviewed with the patient as documented in the AVS. This note was prepared with assistance of Dragon voice recognition software. Occasional wrong-word or sound-a-like substitutions may have occurred due to the inherent limitations of voice recognition software

## 2019-10-20 NOTE — Patient Instructions (Addendum)
Please return for your annual complete physical; please come fasting. With Len Blalock, PA   If you have any questions or concerns, please don't hesitate to send me a message via MyChart or call the office at [phone]. Thank you for visiting with [place] today! It's our pleasure caring for you.   Gout  Gout is a condition that causes painful swelling of the joints. Gout is a type of inflammation of the joints (arthritis). This condition is caused by having too much uric acid in the body. Uric acid is a chemical that forms when the body breaks down substances called purines. Purines are important for building body proteins. When the body has too much uric acid, sharp crystals can form and build up inside the joints. This causes pain and swelling. Gout attacks can happen quickly and may be very painful (acute gout). Over time, the attacks can affect more joints and become more frequent (chronic gout). Gout can also cause uric acid to build up under the skin and inside the kidneys. What are the causes? This condition is caused by too much uric acid in your blood. This can happen because:  Your kidneys do not remove enough uric acid from your blood. This is the most common cause.  Your body makes too much uric acid. This can happen with some cancers and cancer treatments. It can also occur if your body is breaking down too many red blood cells (hemolytic anemia).  You eat too many foods that are high in purines. These foods include organ meats and some seafood. Alcohol, especially beer, is also high in purines. A gout attack may be triggered by trauma or stress. What increases the risk? You are more likely to develop this condition if you:  Have a family history of gout.  Are male and middle-aged.  Are male and have gone through menopause.  Are obese.  Frequently drink alcohol, especially beer.  Are dehydrated.  Lose weight too quickly.  Have an organ transplant.  Have lead  poisoning.  Take certain medicines, including aspirin, cyclosporine, diuretics, levodopa, and niacin.  Have kidney disease.  Have a skin condition called psoriasis. What are the signs or symptoms? An attack of acute gout happens quickly. It usually occurs in just one joint. The most common place is the big toe. Attacks often start at night. Other joints that may be affected include joints of the feet, ankle, knee, fingers, wrist, or elbow. Symptoms of this condition may include:  Severe pain.  Warmth.  Swelling.  Stiffness.  Tenderness. The affected joint may be very painful to touch.  Shiny, red, or purple skin.  Chills and fever. Chronic gout may cause symptoms more frequently. More joints may be involved. You may also have white or yellow lumps (tophi) on your hands or feet or in other areas near your joints. How is this diagnosed? This condition is diagnosed based on your symptoms, medical history, and physical exam. You may have tests, such as:  Blood tests to measure uric acid levels.  Removal of joint fluid with a thin needle (aspiration) to look for uric acid crystals.  X-rays to look for joint damage. How is this treated? Treatment for this condition has two phases: treating an acute attack and preventing future attacks. Acute gout treatment may include medicines to reduce pain and swelling, including:  NSAIDs.  Steroids. These are strong anti-inflammatory medicines that can be taken by mouth (orally) or injected into a joint.  Colchicine. This medicine relieves pain  and swelling when it is taken soon after an attack. It can be given by mouth or through an IV. Preventive treatment may include:  Daily use of smaller doses of NSAIDs or colchicine.  Use of a medicine that reduces uric acid levels in your blood.  Changes to your diet. You may need to see a dietitian about what to eat and drink to prevent gout. Follow these instructions at home: During a gout  attack   If directed, put ice on the affected area: ? Put ice in a plastic bag. ? Place a towel between your skin and the bag. ? Leave the ice on for 20 minutes, 2-3 times a day.  Raise (elevate) the affected joint above the level of your heart as often as possible.  Rest the joint as much as possible. If the affected joint is in your leg, you may be given crutches to use.  Follow instructions from your health care provider about eating or drinking restrictions. Avoiding future gout attacks  Follow a low-purine diet as told by your dietitian or health care provider. Avoid foods and drinks that are high in purines, including liver, kidney, anchovies, asparagus, herring, mushrooms, mussels, and beer.  Maintain a healthy weight or lose weight if you are overweight. If you want to lose weight, talk with your health care provider. It is important that you do not lose weight too quickly.  Start or maintain an exercise program as told by your health care provider. Eating and drinking  Drink enough fluids to keep your urine pale yellow.  If you drink alcohol: ? Limit how much you use to:  0-1 drink a day for women.  0-2 drinks a day for men. ? Be aware of how much alcohol is in your drink. In the [place] one drink equals one 12 oz bottle of beer (355 mL) one 5 oz glass of wine (148 mL), or one 1 oz glass of hard liquor (44 mL). General instructions  Take over-the-counter and prescription medicines only as told by your health care provider.  Do not drive or use heavy machinery while taking prescription pain medicine.  Return to your normal activities as told by your health care provider. Ask your health care provider what activities are safe for you.  Keep all follow-up visits as told by your health care provider. This is important. Contact a health care provider if you have:  Another gout attack.  Continuing symptoms of a gout attack after 10 days of treatment.  Side effects from  your medicines.  Chills or a fever.  Burning pain when you urinate.  Pain in your lower back or belly. Get help right away if you:  Have severe or uncontrolled pain.  Cannot urinate. Summary  Gout is painful swelling of the joints caused by inflammation.  The most common site of pain is the big toe, but it can affect other joints in the body.  Medicines and dietary changes can help to prevent and treat gout attacks. This information is not intended to replace advice given to you by your health care provider. Make sure you discuss any questions you have with your health care provider. Document Released: 12/08/2000 Document Revised: 07/03/2018 Document Reviewed: 07/03/2018 Elsevier Patient Education  2020 [employer].

## 2019-12-08 ENCOUNTER — Ambulatory Visit (INDEPENDENT_AMBULATORY_CARE_PROVIDER_SITE_OTHER): Payer: Self-pay | Admitting: Physician Assistant

## 2019-12-08 ENCOUNTER — Other Ambulatory Visit: Payer: Self-pay

## 2019-12-08 ENCOUNTER — Encounter: Payer: Self-pay | Admitting: Physician Assistant

## 2019-12-08 VITALS — BP 130/86 | HR 108 | Temp 97.9°F | Ht 71.5 in | Wt 228.2 lb

## 2019-12-08 DIAGNOSIS — M109 Gout, unspecified: Secondary | ICD-10-CM

## 2019-12-08 DIAGNOSIS — M1000 Idiopathic gout, unspecified site: Secondary | ICD-10-CM

## 2019-12-08 MED ORDER — HYDROCODONE-ACETAMINOPHEN 5-325 MG PO TABS: 1.0000 | ORAL_TABLET | Freq: Four times a day (QID) | ORAL | 0 refills | Status: DC | PRN

## 2019-12-08 MED ORDER — PREDNISONE 10 MG PO TABS: ORAL_TABLET | ORAL | 0 refills | Status: DC

## 2019-12-08 NOTE — Progress Notes (Signed)
Elijah King is a 48 y.o. male here for a follow up of a pre-existing problem.  I acted as a Education administrator for [employer], PA-C Anselmo Pickler, LPN  History of Present Illness:   Chief Complaint  Patient presents with  . Gout    HPI   Gout Pt c/o having a flare right foot started 3 weeks ago and getting worse. Right foot is red on inner and outer aspect of foot, swollen and painful. Unable to put shoe on today and thinks might be starting in Lt foot.  He has not taken any Ibuprofen or Tylenol says does not work.  Has longstanding history of gout flares. Thinks he was on urate-lowering therapy at one point but he is unsure.  Past Medical History:  Diagnosis Date  . Chronic kidney disease      Social History   Socioeconomic History  . Marital status: Married    Spouse name: Not on file  . Number of children: Not on file  . Years of education: Not on file  . Highest education level: Not on file  Occupational History  . Not on file  Tobacco Use  . Smoking status: Former Smoker    Packs/day: 0.50    Types: Cigarettes    Start date: 03/03/1989    Quit date: 12/25/2017    Years since quitting: 1.9  . Smokeless tobacco: Never Used  Substance and Sexual Activity  . Alcohol use: Yes    Alcohol/week: 0.0 standard drinks    Comment: twice weekly 5 drinks total  . Drug use: No  . Sexual activity: Yes  Other Topics Concern  . Not on file  Social History Narrative   Has a desk job   Live in [place]   Married, 1 child (son)   Social Determinants of Health   Financial Resource Strain:   . Difficulty of Paying Living Expenses: Not on file  Food Insecurity:   . Worried About Charity fundraiser in the Last Year: Not on file  . Ran Out of Food in the Last Year: Not on file  Transportation Needs:   . Lack of Transportation (Medical): Not on file  . Lack of Transportation (Non-Medical): Not on file  Physical Activity:   . Days of Exercise per Week: Not on file  . Minutes of  Exercise per Session: Not on file  Stress:   . Feeling of Stress : Not on file  Social Connections:   . Frequency of Communication with Friends and Family: Not on file  . Frequency of Social Gatherings with Friends and Family: Not on file  . Attends Religious Services: Not on file  . Active Member of Clubs or Organizations: Not on file  . Attends Archivist Meetings: Not on file  . Marital Status: Not on file  Intimate Partner Violence:   . Fear of Current or Ex-Partner: Not on file  . Emotionally Abused: Not on file  . Physically Abused: Not on file  . Sexually Abused: Not on file    Past Surgical History:  Procedure Laterality Date  . KIDNEY CYST REMOVAL Left 1983    Family History  Problem Relation Age of Onset  . Anuerysm Father   . Colon cancer Neg Hx   . Prostate cancer Neg Hx     No Known Allergies  Current Medications:   Current Outpatient Medications:  .  indomethacin (INDOCIN) 50 MG capsule, Take 1 capsule (50 mg total) by mouth 3 (three) times  daily with meals., Disp: 90 capsule, Rfl: 1 .  HYDROcodone-acetaminophen (NORCO/VICODIN) 5-325 MG tablet, Take 1 tablet by mouth every 6 (six) hours as needed for moderate pain., Disp: 20 tablet, Rfl: 0 .  predniSONE (DELTASONE) 10 MG tablet, Take 4 tabs x 2 days, then 3 tabs x 2 days, 2 tabs x 2 days, then 1 tab x 3 days., Disp: 21 tablet, Rfl: 0   Review of Systems:   ROS Negative unless otherwise specified per HPI.  Vitals:   Vitals:   12/08/19 1341  BP: 130/86  Pulse: (!) 108  Temp: 97.9 F (36.6 C)  TempSrc: Temporal  SpO2: 97%  Weight: 228 lb 4 oz (103.5 kg)  Height: 5' 11.5" (1.816 m)     Body mass index is 31.39 kg/m.  Physical Exam:   Physical Exam Vitals and nursing note reviewed.  Constitutional:      General: He is not in acute distress.    Appearance: He is well-developed. He is not ill-appearing or toxic-appearing.  Cardiovascular:     Rate and Rhythm: Normal rate and regular  rhythm.     Pulses: Normal pulses.     Heart sounds: Normal heart sounds, S1 normal and S2 normal.     Comments: No LE edema Pulmonary:     Effort: Pulmonary effort is normal.     Breath sounds: Normal breath sounds.  Musculoskeletal:     Comments: Erythematous and swollen MTP joint on R foot  Skin:    General: Skin is warm and dry.  Neurological:     Mental Status: He is alert.     GCS: GCS eye subscore is 4. GCS verbal subscore is 5. GCS motor subscore is 6.  Psychiatric:        Speech: Speech normal.        Behavior: Behavior normal. Behavior is cooperative.      Assessment and Plan:   [place] was seen today for gout.  Diagnoses and all orders for this visit:  Polyarticular gout Gout flare. Will trial oral prednisone taper. Short script of norco provided for breakthrough pain. I recommended that he follow-up with me after flare so we can have conversation about prevention of gout flares.  Other orders -     predniSONE (DELTASONE) 10 MG tablet; Take 4 tabs x 2 days, then 3 tabs x 2 days, 2 tabs x 2 days, then 1 tab x 3 days. -     HYDROcodone-acetaminophen (NORCO/VICODIN) 5-325 MG tablet; Take 1 tablet by mouth every 6 (six) hours as needed for moderate pain.    . Reviewed expectations re: course of current medical issues. . Discussed self-management of symptoms. . Outlined signs and symptoms indicating need for more acute intervention. . Patient verbalized understanding and all questions were answered. . See orders for this visit as documented in the electronic medical record. . Patient received an After-Visit Summary.  CMA or LPN served as scribe during this visit. History, Physical, and Plan performed by medical provider. The above documentation has been reviewed and is accurate and complete.   [name], PA-C

## 2019-12-08 NOTE — Patient Instructions (Signed)
It was great to see you!  When your flare is gone, please call and make an appointment so we can discuss medication to reduce gout attacks.  Take care,  [name] PA-C

## 2019-12-22 ENCOUNTER — Telehealth: Payer: Self-pay

## 2019-12-22 NOTE — Telephone Encounter (Signed)
Copied from [place] [id number]. Topic: General - Inquiry >> Dec 22, 2019  2:11 PM Mathis Bud wrote: Reason for CRM: Patient states he needed to call in with a update on gout.  Patient states he believes he has gout in left knee.  Patient did have gout in right foot but it did heal. Call back [phone]

## 2019-12-23 ENCOUNTER — Ambulatory Visit (INDEPENDENT_AMBULATORY_CARE_PROVIDER_SITE_OTHER): Payer: Self-pay | Admitting: Physician Assistant

## 2019-12-23 ENCOUNTER — Encounter: Payer: Self-pay | Admitting: Family Medicine

## 2019-12-23 ENCOUNTER — Other Ambulatory Visit: Payer: Self-pay

## 2019-12-23 ENCOUNTER — Encounter: Payer: Self-pay | Admitting: Physician Assistant

## 2019-12-23 ENCOUNTER — Ambulatory Visit (INDEPENDENT_AMBULATORY_CARE_PROVIDER_SITE_OTHER): Payer: Self-pay | Admitting: Family Medicine

## 2019-12-23 VITALS — BP 154/104 | HR 137

## 2019-12-23 VITALS — Ht 71.5 in | Wt 228.0 lb

## 2019-12-23 DIAGNOSIS — M109 Gout, unspecified: Secondary | ICD-10-CM

## 2019-12-23 DIAGNOSIS — M25562 Pain in left knee: Secondary | ICD-10-CM

## 2019-12-23 MED ORDER — ALLOPURINOL 300 MG PO TABS: 300.0000 mg | ORAL_TABLET | Freq: Two times a day (BID) | ORAL | 1 refills | Status: DC

## 2019-12-23 MED ORDER — HYDROCODONE-ACETAMINOPHEN 5-325 MG PO TABS: 1.0000 | ORAL_TABLET | Freq: Four times a day (QID) | ORAL | 0 refills | Status: DC | PRN

## 2019-12-23 MED ORDER — COLCHICINE 0.6 MG PO TABS: 0.6000 mg | ORAL_TABLET | Freq: Every day | ORAL | 3 refills | Status: DC

## 2019-12-23 NOTE — Patient Instructions (Addendum)
Thank you for coming in today.  Take colchicine daily to prevent gout attack while allopurinol is getting started.  Take colchicine for about 1-3 months.  Start allopurinol tonight and take daily to reduce uric acid levels and get rid of gout for good.  Use hydrocodone sparingly or severe pain.  Ok to return for injection at any time.  I also could prescribe oral prednisone if needed.  Let me know.   Plan on getting labs with me or Sam in February once your have insurance.   Colchicine tablets or capsules What is this medicine? COLCHICINE (KOL chi seen) is used to prevent or treat attacks of acute gout or gouty arthritis. This medicine is also used to treat familial Mediterranean fever. This medicine may be used for other purposes; ask your health care provider or pharmacist if you have questions. COMMON BRAND NAME(S): Colcrys, MITIGARE What should I tell my health care provider before I take this medicine? They need to know if you have any of these conditions:  kidney disease  liver disease  an unusual or allergic reaction to colchicine, other medicines, foods, dyes, or preservatives  pregnant or trying to get pregnant  breast-feeding How should I use this medicine? Take this medicine by mouth with a full glass of water. Follow the directions on the prescription label. You can take it with or without food. If it upsets your stomach, take it with food. Take your medicine at regular intervals. Do not take your medicine more often than directed. A special MedGuide will be given to you by the pharmacist with each prescription and refill. Be sure to read this information carefully each time. Talk to your pediatrician regarding the use of this medicine in children. While this drug may be prescribed for children as young as 58 years old for selected conditions, precautions do apply. Patients over 47 years old may have a stronger reaction and need a smaller dose. Overdosage: If you think  you have taken too much of this medicine contact a poison control center or emergency room at once. NOTE: This medicine is only for you. Do not share this medicine with others. What if I miss a dose? If you miss a dose, take it as soon as you can. If it is almost time for your next dose, take only that dose. Do not take double or extra doses. What may interact with this medicine? Do not take this medicine with any of the following medications:  certain antivirals for HIV or hepatitis This medicine may also interact with the following medications:  certain antibiotics like erythromycin or clarithromycin  certain medicines for blood pressure, heart disease, irregular heart beat  certain medicines for cholesterol like atorvastatin, lovastatin, and simvastatin  certain medicines for fungal infections like ketoconazole, itraconazole, or posaconazole  cyclosporine  grapefruit or grapefruit juice This list may not describe all possible interactions. Give your health care provider a list of all the medicines, herbs, non-prescription drugs, or dietary supplements you use. Also tell them if you smoke, drink alcohol, or use illegal drugs. Some items may interact with your medicine. What should I watch for while using this medicine? Visit your healthcare professional for regular checks on your progress. Tell your healthcare professional if your symptoms do not start to get better or if they get worse. You should make sure you get enough vitamin B12 while you are taking this medicine. Discuss the foods you eat and the vitamins you take with your healthcare professional. This medicine  may increase your risk to bruise or bleed. Call you healthcare professional if you notice any unusual bleeding. What side effects may I notice from receiving this medicine? Side effects that you should report to your doctor or health care professional as soon as possible:  allergic reactions like skin rash, itching or  hives, swelling of the face, lips, or tongue  low blood counts - this medicine may decrease the number of white blood cells, red blood cells, and platelets. You may be at increased risk for infections and bleeding  pain, tingling, numbness in the hands or feet  severe diarrhea  signs and symptoms of infection like fever; chills; cough; sore throat; pain or trouble passing urine  signs and symptoms of muscle injury like dark urine; trouble passing urine or change in the amount of urine; unusually weak or tired; muscle pain; back pain  unusual bleeding or bruising  unusually weak or tired  vomiting Side effects that usually do not require medical attention (report these to your doctor or health care professional if they continue or are bothersome):  mild diarrhea  nausea  stomach pain This list may not describe all possible side effects. Call your doctor for medical advice about side effects. You may report side effects to FDA at 1-800-FDA-1088. Where should I keep my medicine? Keep out of the reach of children. Store at room temperature between 15 and 30 degrees C (59 and 86 degrees F). Keep container tightly closed. Protect from light. Throw away any unused medicine after the expiration date. NOTE: This sheet is a summary. It may not cover all possible information. If you have questions about this medicine, talk to your doctor, pharmacist, or health care provider.  2020 Elsevier/Gold Standard (2018-02-27 18:45:11)   Allopurinol tablets What is this medicine? ALLOPURINOL (al oh PURE i nole) reduces the amount of uric acid the body makes. It is used to treat the symptoms of gout. It is also used to treat or prevent high uric acid levels that occur as a result of certain types of chemotherapy. This medicine may also help patients who frequently have kidney stones. This medicine may be used for other purposes; ask your health care provider or pharmacist if you have questions. COMMON  BRAND NAME(S): Zyloprim What should I tell my health care provider before I take this medicine? They need to know if you have any of these conditions:  kidney disease  liver disease  an unusual or allergic reaction to allopurinol, other medicines, foods, dyes, or preservatives  pregnant or trying to get pregnant  breast feeding How should I use this medicine? Take this medicine by mouth with a glass of water. Follow the directions on the prescription label. If this medicine upsets your stomach, take it with food or milk. Take your doses at regular intervals. Do not take your medicine more often than directed. Talk to your pediatrician regarding the use of this medicine in children. Special care may be needed. While this drug may be prescribed for children as young as 6 years for selected conditions, precautions do apply. Overdosage: If you think you have taken too much of this medicine contact a poison control center or emergency room at once. NOTE: This medicine is only for you. Do not share this medicine with others. What if I miss a dose? If you miss a dose, take it as soon as you can. If it is almost time for your next dose, take only that dose. Do not take double or  extra doses. What may interact with this medicine? Do not take this medicine with the following medication:  didanosine, ddI This medicine may also interact with the following medications:  certain antibiotics like amoxicillin, ampicillin  certain medicines for cancer  certain medicines for immunosuppression like azathioprine, cyclosporine, mercaptopurine  chlorpropamide  probenecid  thiazide diuretics, like hydrochlorothiazide  sulfinpyrazone  warfarin This list may not describe all possible interactions. Give your health care provider a list of all the medicines, herbs, non-prescription drugs, or dietary supplements you use. Also tell them if you smoke, drink alcohol, or use illegal drugs. Some items may  interact with your medicine. What should I watch for while using this medicine? Visit your doctor or healthcare provider for regular checks on your progress. If you are taking this medicine to treat gout, you may not have less frequent attacks at first. Keep taking your medicine regularly and the attacks should get better within 2 to 6 weeks. Drink plenty of water (10 to 12 full glasses a day) while you are taking this medicine. This will help to reduce stomach upset and reduce the risk of getting gout or kidney stones. Call your doctor or healthcare provider at once if you get a skin rash together with chills, fever, sore throat, or nausea and vomiting, if you have blood in your urine, or difficulty passing urine. This medicine may cause serious skin reactions. They can happen weeks to months after starting the medicine. Contact your healthcare provider right away if you notice fevers or flu-like symptoms with a rash. The rash may be red or purple and then turn into blisters or peeling of the skin. Or, you might notice a red rash with swelling of the face, lips or lymph nodes in your neck or under your arms. Do not take vitamin C without asking your doctor or healthcare provider. Too much vitamin C can increase the chance of getting kidney stones. You may get drowsy or dizzy. Do not drive, use machinery, or do anything that needs mental alertness until you know how this drug affects you. Do not stand or sit up quickly, especially if you are an older patient. This reduces the risk of dizzy or fainting spells. Alcohol can make you more drowsy and dizzy. Alcohol can also increase the chance of stomach problems and increase the amount of uric acid in your blood. Avoid alcoholic drinks. What side effects may I notice from receiving this medicine? Side effects that you should report to your doctor or health care professional as soon as possible:  allergic reactions like skin rash, itching or hives, swelling of  the face, lips, or tongue  breathing problems  joint pain  muscle pain  rash, fever, and swollen lymph nodes  redness, blistering, peeling, or loosening of the skin, including inside the mouth  signs and symptoms of infection like fever or chills; cough; sore throat  signs and symptoms of kidney injury like trouble passing urine or change in the amount of urine, flank pain  tingling, numbness in the hands or feet  unusual bleeding or bruising  unusually weak or tired Side effects that usually do not require medical attention (report to your doctor or health care professional if they continue or are bothersome):  changes in taste  diarrhea  drowsiness  headache  nausea, vomiting  stomach upset This list may not describe all possible side effects. Call your doctor for medical advice about side effects. You may report side effects to FDA at 1-800-FDA-1088. Where  should I keep my medicine? Keep out of the reach of children. Store at room temperature between 15 and 25 degrees C (59 and 77 degrees F). Protect from light and moisture. Throw away any unused medicine after the expiration date. NOTE: This sheet is a summary. It may not cover all possible information. If you have questions about this medicine, talk to your doctor, pharmacist, or health care provider.  2020 Elsevier/Gold Standard (2019-03-04 09:41:46)

## 2019-12-23 NOTE — Progress Notes (Signed)
   TELEPHONE ENCOUNTER   Patient verbally agreed to telephone visit and is aware that copayment and coinsurance may apply. Patient was treated using telemedicine according to accepted telemedicine protocols.  Location of the patient: home Location of provider: [place] Names of all persons participating in the telemedicine service and role in the encounter: [name], PA , Elijah King (patient)  Subjective:   Chief Complaint  Patient presents with  . Gout     HPI   Gout Pt c/o gout flare in Left knee, started 2 days ago. Pt can not get out of bed. Pt was seen and treated for gout Right foot on 12/14 -- was treated with oral prednisone and norco.  He has been having ongoing intermittent severe gout attacks. Unfortunately has not been able to come in to see me when NOT having a flare.  Denies: chest pain, SOB, calf pain/swelling, fever, chills  Patient Active Problem List   Diagnosis Date Noted  . Elevated blood pressure reading 03/28/2017  . Snoring 03/28/2017  . 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 Chronic kidney disease 03/28/2017  . Former smoker 03/28/2017  . Obesity (BMI 30-39.9) 03/28/2017  . Gastroesophageal reflux disease 03/28/2017  . 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 Smoking status: Former Smoker    Packs/day: 0.50    Types: Cigarettes    Start date: 03/03/1989    Quit date: 12/25/2017    Years since quitting: 1.9  . Smokeless tobacco: Never Used  Substance Use Topics  . Alcohol use: Yes    Alcohol/week: 0.0 standard drinks    Comment: twice weekly 5 drinks total    Current Outpatient Medications:  .  indomethacin (INDOCIN) 50 MG capsule, Take 1 capsule (50 mg total) by mouth 3 (three) times daily with meals., Disp: 90 capsule, Rfl: 1 No Known Allergies  Assessment & Plan:   1. Acute pain of left knee   Urgent referral to Sports Medicine to see Dr. Georgina Snell today for further evaluation and treatment.  Appreciate coordination of care.  Orders Placed This Encounter  Procedures  . Ambulatory referral to Sports Medicine   No orders of the defined types were placed in this encounter.   [name], [place] 12/23/2019  Time spent with the patient: 8 minutes, spent in obtaining information about his symptoms, reviewing his previous labs, evaluations, and treatments, counseling him about his condition (please see the discussed topics above), and developing a plan to further investigate it; he had a number of questions which I addressed.

## 2019-12-23 NOTE — Telephone Encounter (Signed)
Please see message and advise 

## 2019-12-23 NOTE — Telephone Encounter (Signed)
Please schedule appointment for patient, may use one of my blocks if needed.

## 2019-12-23 NOTE — Progress Notes (Signed)
Subjective:    CC: Left knee pain  HPI: Elijah King has a long history of repeated gout flares in his feet ankle and knees.  He developed left knee pain and swelling a few days ago.  His pain is typical for gout flare although it is quite severe.  He does not recall any injury or potentially exacerbating factor.  He took some indomethacin which typically helps his gout flares which did not help this time.  Its been sometime since he had uric acid level and renal labs done due to his lack of health insurance.  Fortunately he notes that he will get health insurance starting in early February.  He would like to delay labs and imaging until then if possible.  Past medical history, Surgical history, Family history not pertinant except as noted below, Social history, Allergies, and medications have been entered into the medical record, reviewed, and no changes needed.   Review of Systems: No headache, visual changes, nausea, vomiting, diarrhea, constipation, dizziness, abdominal pain, skin rash, fevers, chills, night sweats, weight loss, swollen lymph nodes, body aches, joint swelling, muscle aches, chest pain, shortness of breath, mood changes, visual or auditory hallucinations.   Objective:    Vitals:   12/23/19 1527  BP: (!) 154/104  Pulse: (!) 137  SpO2: 97%   General: Well Developed, well nourished, and in no acute distress.  Neuro/Psych: Alert and oriented x3, extra-ocular muscles intact, able to move all 4 extremities, sensation grossly intact. Skin: Warm and dry, no rashes noted.  Respiratory: Not using accessory muscles, speaking in full sentences, trachea midline.  Cardiovascular: Pulses palpable, no extremity edema. Abdomen: Does not appear distended. MSK:  Left knee: Moderate effusion no erythema otherwise normal-appearing Range of motion 10-90 degrees significant pain with motion. Diffusely tender to light touch. Stability exam not tested due to guarding. Patient  significantly painful strength testing not available.  Lab and Radiology Results   Chemistry      Component Value Date/Time   NA 139 09/13/2018 1530   K 4.6 09/13/2018 1530   CL 103 09/13/2018 1530   CO2 27 09/13/2018 1530   BUN 16 09/13/2018 1530   CREATININE 1.28 09/13/2018 1530      Component Value Date/Time   CALCIUM 9.7 09/13/2018 1530   ALKPHOS 60 04/11/2017 0943   AST 25 09/13/2018 1530   ALT 37 09/13/2018 1530   BILITOT 0.3 09/13/2018 1530     Component     Latest Ref Rng & Units 11/13/2014 05/27/2015 05/13/2016 08/26/2016  Uric Acid, Serum     4.0 - 8.0 mg/dL 8.8 (H) 6.6 6.1 4.5    Impression and Recommendations:    Assessment and Plan: 48 y.o. male with left knee pain and effusion secondary to gout flare.  Discussed treatment plan and options. Plan to proceed with allopurinol to lower uric acid.  We will start at 300 mg daily.  We will also start colchicine 0.6 mg daily for 1-3 months for gout prophylaxis while getting started on allopurinol to prevent gout flares.  We will treat current immediate pain with limited prescription of hydrocodone. Discussed the possibility of proceeding with either oral steroid like prednisone or potentially even joint injection however we will hold off on that for now.   However patient will need further evaluation in the near future.  Recommend complete metabolic panel and uric acid in February once he gets his health insurance.  I am happy to order these labs myself however may be reasonable  to have PCP order the specific labs in addition to needed primary care labs such as cholesterol etc.  Happy to help coordinate care along with rest of medical team.  Recheck back with me as needed.  PDMP reviewed during this encounter. No orders of the defined types were placed in this encounter.  Meds ordered this encounter  Medications  . colchicine 0.6 MG tablet    Sig: Take 1 tablet (0.6 mg total) by mouth daily.    Dispense:  30 tablet     Refill:  3  . allopurinol (ZYLOPRIM) 300 MG tablet    Sig: Take 1 tablet (300 mg total) by mouth 2 (two) times daily.    Dispense:  90 tablet    Refill:  1  . HYDROcodone-acetaminophen (NORCO/VICODIN) 5-325 MG tablet    Sig: Take 1 tablet by mouth every 6 (six) hours as needed.    Dispense:  15 tablet    Refill:  0    Discussed warning signs or symptoms. Please see discharge instructions. Patient expresses understanding.

## 2019-12-23 NOTE — Telephone Encounter (Signed)
Made virtual appt for pt today at 2pm.

## 2019-12-24 ENCOUNTER — Telehealth: Payer: Self-pay

## 2019-12-24 NOTE — Telephone Encounter (Signed)
Patient called back with some questions about visit yesterday for gout. States that he was told he could get a shot for the pain and was wanting to know more information about that. Looked in his chart and seen that Dr. Georgina Snell was talking about a knee injection, informed patient about that type of injection and that he can schedule at anytime. Patient stated understanding and will see how the allopurinol and colchicine works over the weekend and if not will call Monday.

## 2019-12-29 ENCOUNTER — Other Ambulatory Visit: Payer: Self-pay

## 2019-12-29 ENCOUNTER — Telehealth: Payer: Self-pay

## 2019-12-29 ENCOUNTER — Ambulatory Visit: Payer: Self-pay

## 2019-12-29 ENCOUNTER — Encounter: Payer: Self-pay | Admitting: Family Medicine

## 2019-12-29 ENCOUNTER — Ambulatory Visit (INDEPENDENT_AMBULATORY_CARE_PROVIDER_SITE_OTHER): Payer: Self-pay

## 2019-12-29 ENCOUNTER — Ambulatory Visit (INDEPENDENT_AMBULATORY_CARE_PROVIDER_SITE_OTHER): Payer: Self-pay | Admitting: Family Medicine

## 2019-12-29 VITALS — BP 150/90 | HR 125 | Ht 71.5 in | Wt 228.0 lb

## 2019-12-29 DIAGNOSIS — M1000 Idiopathic gout, unspecified site: Secondary | ICD-10-CM

## 2019-12-29 DIAGNOSIS — M25562 Pain in left knee: Secondary | ICD-10-CM

## 2019-12-29 DIAGNOSIS — M109 Gout, unspecified: Secondary | ICD-10-CM

## 2019-12-29 MED ORDER — HYDROCODONE-ACETAMINOPHEN 10-325 MG PO TABS: 1.0000 | ORAL_TABLET | Freq: Three times a day (TID) | ORAL | 0 refills | Status: DC | PRN

## 2019-12-29 MED ORDER — PREDNISONE 50 MG PO TABS: 50.0000 mg | ORAL_TABLET | Freq: Every day | ORAL | 0 refills | Status: DC

## 2019-12-29 NOTE — Telephone Encounter (Signed)
Recommend patient schedule with me ASAP.  We will proceed with joint injection which should help quite a bit.

## 2019-12-29 NOTE — Telephone Encounter (Signed)
Scheduled patient for today 2:45

## 2019-12-29 NOTE — Telephone Encounter (Signed)
Patient called stating he was supposed to let Dr. Georgina Snell know how he was doing. Gout of left knee is not doing any better, the allopurinol and colchisine does not help relieve any pain and is having trouble sleeping at night because of the constant pain. Would like a call back to discuss additional options

## 2019-12-29 NOTE — Patient Instructions (Signed)
Thank you for coming in today. Use [facility].  Get xray now and ultrasound ASAP.  Recheck with me later this week.  Keep me updated.

## 2019-12-29 NOTE — Progress Notes (Signed)
I, [name], LAT, ATC, am serving as scribe for Dr. Lynne Leader.  Elijah King is a 49 y.o. male who presents to [place] at [facility] today for f/u of L knee pain.  Pt was last seen by Dr. Georgina Snell on 12/23/19 and was prescribed Allopurinol, colchicine and hydrocodone-acetaminophen 5-325 mg.  Since his last visit, pt reports no better is actually feeling worse, has to walk with a walker to get around and the pain is so bad patient is unable to sleep. States he feels some gout trying to start in R lateral foot.      He notes pain is predominantly located at the medial knee at the hamstring insertion.  He notes swelling in this area as well.  He has difficulty extending his knee fully and difficulty with weightbearing.  His pain is severe and worsening despite treatment outlined above with most recent visit.    ROS:  As above  Exam:  BP (!) 150/90 (BP Location: Left Arm, Patient Position: Sitting, Cuff Size: Normal)   Pulse (!) 125   Ht 5' 11.5" (1.816 m)   Wt 228 lb (103.4 kg)   SpO2 98%   BMI 31.36 kg/m  Wt Readings from Last 5 Encounters:  12/29/19 228 lb (103.4 kg)  12/23/19 228 lb (103.4 kg)  12/08/19 228 lb 4 oz (103.5 kg)  11/04/18 221 lb 4 oz (100.4 kg)  09/13/18 221 lb 4 oz (100.4 kg)   General: Well Developed, well nourished, and in no acute distress.  Neuro/Psych: Alert and oriented x3, extra-ocular muscles intact, able to move all 4 extremities, sensation grossly intact. Skin: Warm and dry, no rashes noted.  Respiratory: Not using accessory muscles, speaking in full sentences, trachea midline.  Cardiovascular: Pulses palpable, no extremity edema. Abdomen: Does not appear distended. MSK:  Left knee: Slightly swollen at distal medial hamstring near insertion onto medial knee.  Tender to palpation of this area.  No erythema or subcutaneous crepitation. Knee range of motion 30-90 degrees Intact flexion extension strength however pain with both  motions. Guarding with stability testing nondiagnostic. Guarding with McMurray's testing nondiagnostic.     Lab and Radiology Results  Limited musculoskeletal ultrasound left knee. Quad tendon intact with no significant effusion. Patellar tendon intact. Normal lateral joint line and lateral joint structures. Medial joint line intact meniscus with normal-appearing MCL. Heterogeneous appearance of soft tissue structures underlying medial hamstring tendons and area of swelling and tenderness. No full-thickness defect visible. No increased vascular activity. Posterior knee intact neurovascular structures. Impression: Soft tissue swelling at distal medial hamstring tendons.  DG Knee Complete 4 Views Left  Result Date: 12/29/2019 CLINICAL DATA:  Knee pain for 10 days.  No known injury. EXAM: LEFT KNEE - COMPLETE 4+ VIEW COMPARISON:  None. FINDINGS: No evidence of fracture, dislocation, or joint effusion. No evidence of arthropathy or other focal bone abnormality. Soft tissues are unremarkable. IMPRESSION: Normal exam. Electronically Signed   By: Inge Rise M.D.   On: 12/29/2019 16:47   I, Lynne Leader, personally (independently) visualized and performed the interpretation of the images attached in this note.   Assessment and Plan: 49 y.o. male with  Severe left knee pain with medial swelling.  Etiology is unclear at this point.  Patient does have a history of gout but he does not have a significant effusion to explain his knee pain which would be expected with gout.  Plan to broaden work-up.  Obtained MSK ultrasound and x-ray as above.  We will proceed with further evaluation with vascular ultrasound to evaluate for DVT.  We will treat temporarily for pain with hydrocodone and use prednisone for possible inflammation as cause of pain.  Recheck with me later this week.  Return sooner if needed.  Precautions reviewed. PDMP reviewed during this encounter. Orders Placed This Encounter    Procedures  . No Chg  [place] Lower Lt    Order Specific Question:   Reason for Exam (SYMPTOM  OR DIAGNOSIS REQUIRED)    Answer:   eval knee pain    Order Specific Question:   Preferred imaging location?    Answer:   [place]  . DG Knee Complete 4 Views Left    Standing Status:   Future    Number of Occurrences:   1    Standing Expiration Date:   02/25/2021    Order Specific Question:   Reason for Exam (SYMPTOM  OR DIAGNOSIS REQUIRED)    Answer:   eval medial knee pain    Order Specific Question:   Preferred imaging location?    Answer:   [facility]    Order Specific Question:   Radiology Contrast Protocol - do NOT remove file path    Answer:   \\[email or web address]   Meds ordered this encounter  Medications  . HYDROcodone-acetaminophen (NORCO) 10-325 MG tablet    Sig: Take 1 tablet by mouth every 8 (eight) hours as needed.    Dispense:  20 tablet    Refill:  0  . predniSONE (DELTASONE) 50 MG tablet    Sig: Take 1 tablet (50 mg total) by mouth daily.    Dispense:  5 tablet    Refill:  0    Historical information moved to improve visibility of documentation.  Past Medical History:  Diagnosis Date  . Chronic kidney disease    Past Surgical History:  Procedure Laterality Date  . KIDNEY CYST REMOVAL Left 1983   Social History   Tobacco Use  . Smoking status: Former Smoker    Packs/day: 0.50    Types: Cigarettes    Start date: 03/03/1989    Quit date: 12/25/2017    Years since quitting: 2.0  . Smokeless tobacco: Never Used  Substance Use Topics  . Alcohol use: Yes    Alcohol/week: 0.0 standard drinks    Comment: twice weekly 5 drinks total   family history includes Anuerysm in his father.  Medications: Current Outpatient Medications  Medication Sig Dispense Refill  . allopurinol (ZYLOPRIM) 300 MG tablet Take 1 tablet (300 mg total) by mouth 2 (two) times daily. 90 tablet 1  . colchicine 0.6 MG  tablet Take 1 tablet (0.6 mg total) by mouth daily. 30 tablet 3  . HYDROcodone-acetaminophen (NORCO) 10-325 MG tablet Take 1 tablet by mouth every 8 (eight) hours as needed. 20 tablet 0  . indomethacin (INDOCIN) 50 MG capsule Take 1 capsule (50 mg total) by mouth 3 (three) times daily with meals. 90 capsule 1  . predniSONE (DELTASONE) 50 MG tablet Take 1 tablet (50 mg total) by mouth daily. 5 tablet 0   No current facility-administered medications for this visit.   No Known Allergies    Discussed warning signs or symptoms. Please see discharge instructions. Patient expresses understanding.  The above documentation has been reviewed and is accurate and complete Lynne Leader

## 2019-12-30 ENCOUNTER — Other Ambulatory Visit (HOSPITAL_COMMUNITY): Payer: Self-pay | Admitting: Physician Assistant

## 2019-12-30 ENCOUNTER — Ambulatory Visit (HOSPITAL_COMMUNITY)
Admission: RE | Admit: 2019-12-30 | Discharge: 2019-12-30 | Disposition: A | Discharge status: Home / Self Care | Payer: Self-pay | Source: Ambulatory Visit | Attending: Cardiology | Admitting: Cardiology

## 2019-12-30 DIAGNOSIS — M79661 Pain in right lower leg: Secondary | ICD-10-CM

## 2019-12-30 DIAGNOSIS — M25562 Pain in left knee: Secondary | ICD-10-CM | POA: Insufficient documentation

## 2019-12-30 NOTE — Progress Notes (Signed)
Knee x-ray is normal appearing to radiology

## 2020-01-01 NOTE — Progress Notes (Signed)
DVT ultrasound shows no DVT but they did also see the same soft tissue swelling at the inner thigh that I saw.

## 2020-02-06 ENCOUNTER — Telehealth: Payer: Self-pay | Admitting: Physician Assistant

## 2020-02-06 DIAGNOSIS — M1000 Idiopathic gout, unspecified site: Secondary | ICD-10-CM

## 2020-02-06 DIAGNOSIS — Z13220 Encounter for screening for lipoid disorders: Secondary | ICD-10-CM

## 2020-02-06 DIAGNOSIS — M109 Gout, unspecified: Secondary | ICD-10-CM

## 2020-02-06 NOTE — Telephone Encounter (Signed)
Please call patient and let him know that he is due for gout labs, per Dr. Clovis Riley recommendation.  I have put the labs in for him. Please schedule an appointment sometime this month for him to come by for a lab only visit here at our office.  Thanks!  Aldona Bar

## 2020-02-06 NOTE — Telephone Encounter (Signed)
-----   Message from Gregor Hams, MD sent at 02/05/2020 12:01 PM EST ----- Regarding: FW: Check uric acid metabolic panel in February I sent myself a reminder to check metabolic panel and uric acid in February to follow-up gout.  Do you want me to place the orders or will you so that you can get other labs if needed?   Ellard Artis ----- Message ----- From: Gregor Hams, MD Sent: 01/26/2020 To: Gregor Hams, MD Subject: Check uric acid metabolic panel in February    Check metabolic panel and uric acid in February to follow-up gout.  May be done by PCP.

## 2020-02-06 NOTE — Telephone Encounter (Signed)
Called and LVM for pt to schedule appt.

## 2020-02-20 ENCOUNTER — Encounter: Payer: Self-pay | Admitting: Family Medicine

## 2020-02-25 ENCOUNTER — Encounter: Payer: Self-pay | Admitting: Family Medicine

## 2020-02-25 ENCOUNTER — Ambulatory Visit (INDEPENDENT_AMBULATORY_CARE_PROVIDER_SITE_OTHER): Payer: Commercial Managed Care - PPO | Admitting: Family Medicine

## 2020-02-25 ENCOUNTER — Other Ambulatory Visit: Payer: Self-pay

## 2020-02-25 ENCOUNTER — Ambulatory Visit: Payer: Self-pay

## 2020-02-25 VITALS — BP 112/88 | HR 124 | Ht 71.5 in

## 2020-02-25 DIAGNOSIS — M25562 Pain in left knee: Secondary | ICD-10-CM

## 2020-02-25 DIAGNOSIS — M1000 Idiopathic gout, unspecified site: Secondary | ICD-10-CM | POA: Diagnosis not present

## 2020-02-25 DIAGNOSIS — M109 Gout, unspecified: Secondary | ICD-10-CM

## 2020-02-25 LAB — CBC WITH DIFFERENTIAL/PLATELET
Basophils Absolute: 0 10*3/uL (ref 0.0–0.1)
Basophils Relative: 0.4 % (ref 0.0–3.0)
Eosinophils Absolute: 0 10*3/uL (ref 0.0–0.7)
Eosinophils Relative: 0.2 % (ref 0.0–5.0)
HCT: 45.3 % (ref 39.0–52.0)
Hemoglobin: 15 g/dL (ref 13.0–17.0)
Lymphocytes Relative: 11.9 % — ABNORMAL LOW (ref 12.0–46.0)
Lymphs Abs: 1.2 10*3/uL (ref 0.7–4.0)
MCHC: 33 g/dL (ref 30.0–36.0)
MCV: 91.1 fl (ref 78.0–100.0)
Monocytes Absolute: 0.8 10*3/uL (ref 0.1–1.0)
Monocytes Relative: 7.8 % (ref 3.0–12.0)
Neutro Abs: 8.3 10*3/uL — ABNORMAL HIGH (ref 1.4–7.7)
Neutrophils Relative %: 79.7 % — ABNORMAL HIGH (ref 43.0–77.0)
Platelets: 205 10*3/uL (ref 150.0–400.0)
RBC: 4.98 Mil/uL (ref 4.22–5.81)
RDW: 14.8 % (ref 11.5–15.5)
WBC: 10.4 10*3/uL (ref 4.0–10.5)

## 2020-02-25 LAB — COMPREHENSIVE METABOLIC PANEL
ALT: 41 U/L (ref 0–53)
AST: 25 U/L (ref 0–37)
Albumin: 4.6 g/dL (ref 3.5–5.2)
Alkaline Phosphatase: 67 U/L (ref 39–117)
BUN: 14 mg/dL (ref 6–23)
CO2: 28 mEq/L (ref 19–32)
Calcium: 9.8 mg/dL (ref 8.4–10.5)
Chloride: 101 mEq/L (ref 96–112)
Creatinine, Ser: 1.33 mg/dL (ref 0.40–1.50)
GFR: 57.52 mL/min — ABNORMAL LOW (ref >60.00)
Glucose, Bld: 107 mg/dL — ABNORMAL HIGH (ref 70–99)
Potassium: 4.1 mEq/L (ref 3.5–5.1)
Sodium: 137 mEq/L (ref 135–145)
Total Bilirubin: 0.5 mg/dL (ref 0.2–1.2)
Total Protein: 7.8 g/dL (ref 6.0–8.3)

## 2020-02-25 LAB — URIC ACID: Uric Acid, Serum: 6.2 mg/dL (ref 4.0–7.8)

## 2020-02-25 MED ORDER — HYDROCODONE-ACETAMINOPHEN 10-325 MG PO TABS: 1.0000 | ORAL_TABLET | Freq: Three times a day (TID) | ORAL | 0 refills | Status: DC | PRN

## 2020-02-25 MED ORDER — COLCHICINE 0.6 MG PO TABS: 0.6000 mg | ORAL_TABLET | Freq: Every day | ORAL | 12 refills | Status: DC

## 2020-02-25 NOTE — Progress Notes (Signed)
I, Elijah King, LAT, ATC, am serving as scribe for Dr. Lynne Leader.  Elijah King is a 49 y.o. male who presents to [place] at [facility] today for f/u of L medial knee pain and swelling.  He was last seen by Dr. Georgina Snell on 12/29/19 for worsening L knee pain f/u and was prescribed a short course of prednisone '50mg'$  and hydrocodone-acetaminophen 10-325.  He was also referred for a LE venous doppler that he had on 12/29/19 and was negative for DVT.  Since his last visit, pt reports that his L knee was improving until last night when the pain dramatically increased w/ no known cause/reason.  He rates his pain as a throbbing 8/10 and notes some swelling in the L knee.  He is ambulating w/ a RW and is toe-touch weight bearing due to pain.  He denies any radiating pain.  He is no longer taking the cholchicine or the hyrdrocodone-accetaminophen.  Was taking colchicine until a few weeks ago.  Pain started a few weeks after he stopped taking colchicine.   Pertinent review of systems: No fevers or chills.  Relevant historical information: History of gout   Exam:  BP 112/88 (BP Location: Left Arm, Patient Position: Sitting, Cuff Size: Large)   Pulse (!) 124   Ht 5' 11.5" (1.816 m)   SpO2 99%   BMI 31.36 kg/m  General: Well Developed, well nourished, and in no acute distress.   MSK: Left knee: Moderate effusion.  More swollen along medial knee and distal hamstring. Tender palpation medial knee distal hamstring. Range of motion 10-90 degrees. Patient guards with ligament exam testing nondiagnostic. Guarding with McMurray's test nondiagnostic.     Lab and Radiology Results  Diagnostic Limited MSK Ultrasound of: Left knee: Quad tendon intact small effusion. Patellar tendon intact. Normal lateral and medial joint line meniscus structures. Soft tissue collecting along distal medial hamstring tendon structures.  Small Baker's cyst present. Impression: Knee effusion soft tissue  swelling distal medial hamstring  Procedure: Real-time Ultrasound Guided Injection of left knee Device: Philips Affiniti 50G Images permanently stored and available for review in the ultrasound unit. Verbal informed consent obtained.  Discussed risks and benefits of procedure. Warned about infection bleeding damage to structures skin hypopigmentation and fat atrophy among others. Patient expresses understanding and agreement Time-out conducted.   Noted no overlying erythema, induration, or other signs of local infection.   Skin prepped in a sterile fashion.   Local anesthesia: Topical Ethyl chloride.   With sterile technique and under real time ultrasound guidance:  40 mg of Kenalog and 3 mL of Marcaine injected easily.   Completed without difficulty   Pain moderately  resolved suggesting accurate placement of the medication.   Advised to call if fevers/chills, erythema, induration, drainage, or persistent bleeding.   Images permanently stored and available for review in the ultrasound unit.  Impression: Technically successful ultrasound guided injection.   EXAM: LEFT KNEE - COMPLETE 4+ VIEW  COMPARISON:  None.  FINDINGS: No evidence of fracture, dislocation, or joint effusion. No evidence of arthropathy or other focal bone abnormality. Soft tissues are unremarkable.  IMPRESSION: Normal exam.   Electronically Signed   By: Inge Rise M.D.   On: 12/29/2019 16:47  I, Lynne Leader, personally (independently) visualized and performed the interpretation of the images attached in this note.  Lab Results  Component Value Date   LABURIC 4.5 08/26/2016       Assessment and Plan: 49 y.o. male with  left knee pain and swelling predominantly medial compartment.  Patient had pretty well-controlled symptoms while he was on colchicine.  After stopping colchicine his symptoms returned.  This is probably a gout flare.  However the majority of pain is confined to the medial knee  and distal hamstring which does not make much sense for gout.  We will treat today with steroid injection as above and restart colchicine.  Additionally hydrocodone for pain control.  However the medial pain does not make complete sense for gout and I would like to proceed with MRI to evaluate for identify causes of pain including PVNS.  Recheck in near future. Additionally check labs again to recheck uric acid metabolic panel CBC.   PDMP reviewed during this encounter. Orders Placed This Encounter  Procedures  . [place] LIMITED JOINT SPACE STRUCTURES LOW LEFT(NO LINKED CHARGES)    Order Specific Question:   Reason for Exam (SYMPTOM  OR DIAGNOSIS REQUIRED)    Answer:   L knee pain    Order Specific Question:   Preferred imaging location?    Answer:   [place]  . MR Knee Left  Wo Contrast    Standing Status:   Future    Standing Expiration Date:   04/26/2021    Order Specific Question:   What is the patient's sedation requirement?    Answer:   No Sedation    Order Specific Question:   Does the patient have a pacemaker or implanted devices?    Answer:   No    Order Specific Question:   Preferred imaging location?    Answer:   [facility] (table limit-350lbs)    Order Specific Question:   Radiology Contrast Protocol - do NOT remove file path    Answer:   \\[email or web address]  . CBC with Differential/Platelet    Standing Status:   Future    Number of Occurrences:   1    Standing Expiration Date:   04/26/2020  . Uric acid    Standing Status:   Future    Number of Occurrences:   1    Standing Expiration Date:   04/26/2020  . Comp Met (CMET)    Standing Status:   Future    Number of Occurrences:   1    Standing Expiration Date:   02/24/2021   Meds ordered this encounter  Medications  . colchicine 0.6 MG tablet    Sig: Take 1 tablet (0.6 mg total) by mouth daily.    Dispense:  30 tablet    Refill:  12  . HYDROcodone-acetaminophen  (NORCO) 10-325 MG tablet    Sig: Take 1 tablet by mouth every 8 (eight) hours as needed.    Dispense:  20 tablet    Refill:  0     Discussed warning signs or symptoms. Please see discharge instructions. Patient expresses understanding.   The above documentation has been reviewed and is accurate and complete Lynne Leader

## 2020-02-25 NOTE — Progress Notes (Signed)
Labs are back.  Uric acid is at 6.2 which is close to the goal of 6.0.  Plan to continue current dose of allopurinol.  Continue colchicine like we discussed.

## 2020-02-25 NOTE — Patient Instructions (Addendum)
You had a L knee injection today. Call or go to the ER if you develop a large red swollen joint with extreme pain or oozing puss.   Thank you for coming in today.  Take the hydrocodone for pain.  Restart colchicine.  Get MRI unless all better.  Keep me updated.  I am happy to do letters or forms if needed.

## 2020-02-25 NOTE — Telephone Encounter (Signed)
I called [name] back and schedule him for an appointment with me at 9:30 in the morning.

## 2020-02-28 ENCOUNTER — Other Ambulatory Visit: Payer: Self-pay

## 2020-02-28 ENCOUNTER — Ambulatory Visit (INDEPENDENT_AMBULATORY_CARE_PROVIDER_SITE_OTHER): Payer: Commercial Managed Care - PPO

## 2020-02-28 DIAGNOSIS — M25562 Pain in left knee: Secondary | ICD-10-CM | POA: Diagnosis not present

## 2020-03-01 ENCOUNTER — Encounter: Payer: Self-pay | Admitting: Family Medicine

## 2020-03-01 ENCOUNTER — Ambulatory Visit: Payer: Self-pay

## 2020-03-01 ENCOUNTER — Other Ambulatory Visit: Payer: Self-pay

## 2020-03-01 ENCOUNTER — Ambulatory Visit (INDEPENDENT_AMBULATORY_CARE_PROVIDER_SITE_OTHER): Payer: Commercial Managed Care - PPO | Admitting: Family Medicine

## 2020-03-01 VITALS — BP 170/100 | HR 94 | Ht 71.5 in

## 2020-03-01 DIAGNOSIS — M25562 Pain in left knee: Secondary | ICD-10-CM

## 2020-03-01 MED ORDER — HYDROCODONE-ACETAMINOPHEN 10-325 MG PO TABS: 1.0000 | ORAL_TABLET | Freq: Three times a day (TID) | ORAL | 0 refills | Status: DC | PRN

## 2020-03-01 NOTE — Progress Notes (Signed)
I, [name], LAT, ATC, am serving as scribe for Dr. Lynne Leader.  Elijah King is a 49 y.o. male who presents to [place] at [facility] today for f/u of L knee pain and L knee MRI review.  He was last seen by Dr. Georgina Snell on 02/25/20 and had increased L knee pain to the point that he was walking w/ a RW.  He had a L knee injection, was referred for a L knee MRI and advised to begin taking his cholchicine again which he had stopped.  Since his last visit, pt reports that the pain is slightly better that he can put pressure on the L leg and is not walking with the walker, but states he wishes he had the walker.    Pertinent review of systems: No fevers or chills  Relevant historical information: History of gout   Exam:  BP (!) 170/100 (BP Location: Left Arm, Patient Position: Sitting, Cuff Size: Normal)   Pulse 94   Ht 5' 11.5" (1.816 m)   SpO2 98%   BMI 31.36 kg/m  General: Well Developed, well nourished, and in no acute distress.   MSK: Left knee no significant effusion.  Tender to palpation with slight swelling at posterior medial knee. Decreased knee extension and flexion range of motion.    Lab and Radiology Results No results found for this or any previous visit (from the past 72 hour(s)). MR Knee Left  Wo Contrast  Result Date: 03/01/2020 CLINICAL DATA:  Chronic diffuse left knee pain for 2 months. No known injury or prior relevant surgery. EXAM: MRI OF THE LEFT KNEE WITHOUT CONTRAST TECHNIQUE: Multiplanar, multisequence MR imaging of the knee was performed. No intravenous contrast was administered. COMPARISON:  Radiographs 12/29/2019 FINDINGS: MENISCI Medial meniscus:  Intact with normal morphology. Lateral meniscus:  Intact with normal morphology. LIGAMENTS Cruciates:  Intact. Collaterals:  Intact. CARTILAGE Patellofemoral:  Preserved. Medial: Mild chondral thinning and surface irregularity with mild subchondral cyst formation peripherally in the medial  tibial plateau. Lateral:  Preserved. MISCELLANEOUS Joint:  No significant joint effusion. Popliteal Fossa: There is a complex Baker's cyst with surrounding soft tissue edema. Fluid extends into the pes anserine bursa. Extensor Mechanism:  Intact. Bones:  No acute or significant extra-articular osseous findings. Other: No other significant periarticular soft tissue findings. IMPRESSION: 1. Complex Baker's cyst with surrounding soft tissue edema and pes anserine bursitis. These findings may contribute to posteromedial knee pain. 2. No evidence of internal derangement. The menisci cruciate and collateral ligaments are intact. 3. Mild medial compartment degenerative changes. Electronically Signed   By: Richardean Sale M.D.   On: 03/01/2020 08:15   I, Lynne Leader, personally (independently) visualized and performed the interpretation of the images attached in this note.    Procedure: Real-time Ultrasound Guided aspiration and injection attempt of Baker's cyst left knee  Device: Philips Affiniti 50G Images permanently stored and available for review in the ultrasound unit. Verbal informed consent obtained.  Discussed risks and benefits of procedure. Warned about infection bleeding damage to structures skin hypopigmentation and fat atrophy among others. Patient expresses understanding and agreement Time-out conducted.   Noted no overlying erythema, induration, or other signs of local infection.   Skin prepped in a sterile fashion.   Local anesthesia: Topical Ethyl chloride.   With sterile technique and under real time ultrasound guidance:  25-gauge needle was used to inject 4 mL of lidocaine subcutaneously and to Baker's cyst structure seen on ultrasound and MRI.  Knee was again sterilized an 18-gauge needle was used to access the Baker cystic structure on ultrasound guidance. No fluid is a to be aspirated with several different positionings. Aspiration attempt was abandoned at this point and needle was  removed.   Skin was again sterilized  rubbing alcohol. 22-gauge needle was used to access the Baker's cyst under ultrasound guidance and 3 mL of Marcaine and 40 mg of Kenalog were injected Completed without difficulty   Pain partially resolved suggesting accurate placement of the medication.   Advised to call if fevers/chills, erythema, induration, drainage, or persistent bleeding.   Images permanently stored and available for review in the ultrasound unit.  Impression: Technically successful ultrasound guided injection.       Assessment and Plan: 49 y.o. male with  Left posterior medial knee pain due to Baker's cyst and hamstring tendinitis.  Fundamental ongoing cause of Baker's cyst is probably gout which is now much better.  Hopefully with injection Baker's cyst will be smaller and less painful.  Patient will see me updated how he is feeling in the next few weeks.  We will adjust treatment as needed.  Consider home exercise program for hamstring tendinitis in the near future.   PDMP not reviewed this encounter. Orders Placed This Encounter  Procedures  . [place] LIMITED JOINT SPACE STRUCTURES LOW LEFT(NO LINKED CHARGES)    Order Specific Question:   Reason for Exam (SYMPTOM  OR DIAGNOSIS REQUIRED)    Answer:   eval baker cyst    Order Specific Question:   Preferred imaging location?    Answer:   [place]   Meds ordered this encounter  Medications  . HYDROcodone-acetaminophen (NORCO) 10-325 MG tablet    Sig: Take 1 tablet by mouth every 8 (eight) hours as needed.    Dispense:  20 tablet    Refill:  0     Discussed warning signs or symptoms. Please see discharge instructions. Patient expresses understanding.   The above documentation has been reviewed and is accurate and complete Lynne Leader   Total encounter time 30 minutes including charting time date of service.  Discussed MRI findings next step and backup plan.

## 2020-03-01 NOTE — Progress Notes (Signed)
MRI knee shows a complex Baker's cyst with surrounding soft tissue swelling and Pez anserine bursitis.  This could cause pain on the inside to posterior part of your knee.  No significant meniscus or ligament injury.  Mild arthritis. Recheck in clinic to discuss these findings and discuss next steps.

## 2020-03-01 NOTE — Patient Instructions (Signed)
Thank you for coming in today. Keep me updated.  Use the medicine for pain as needed.  Continue colchicine.  Recheck as needed.   I think this is a Child psychotherapist cyst and some hamstring tendonitis.

## 2020-03-11 ENCOUNTER — Encounter: Payer: Self-pay | Admitting: Family Medicine

## 2020-03-11 ENCOUNTER — Encounter: Payer: Self-pay | Admitting: Physician Assistant

## 2020-05-31 ENCOUNTER — Other Ambulatory Visit: Payer: Self-pay | Admitting: Family Medicine

## 2020-06-18 ENCOUNTER — Ambulatory Visit: Payer: Commercial Managed Care - PPO | Admitting: Physician Assistant

## 2020-06-18 NOTE — Telephone Encounter (Signed)
Left message on voicemail to call office.  

## 2020-06-21 ENCOUNTER — Other Ambulatory Visit: Payer: Self-pay

## 2020-06-21 ENCOUNTER — Encounter: Payer: Self-pay | Admitting: Physician Assistant

## 2020-06-21 ENCOUNTER — Ambulatory Visit: Payer: Commercial Managed Care - PPO | Admitting: Physician Assistant

## 2020-06-21 VITALS — BP 142/72 | HR 84 | Temp 98.4°F | Ht 71.5 in | Wt 214.4 lb

## 2020-06-21 DIAGNOSIS — R1011 Right upper quadrant pain: Secondary | ICD-10-CM | POA: Diagnosis not present

## 2020-06-21 NOTE — Patient Instructions (Signed)
It was great to see you!  We will be in touch with your lab results and your ultrasound results.  If worsening symptoms in the meantime, do not delay care.  Take care,  [name] PA-C

## 2020-06-21 NOTE — Progress Notes (Signed)
Elijah King is a 49 y.o. male here for a new problem.  I acted as a Education administrator for [employer], PA-C [employer], [place]  History of Present Illness:   Chief Complaint  Patient presents with  . Chest Pain    HPI   Right upper quadrant pain Pt c/o RUQ pain that started a week ago. He is having pain today. Pain is not constant. It comes and goes. Worse with certain movement.  He states that at times it radiates to his shoulder blade.  Pain is 4/10. He describes the pain as sharp and stabbing. He has not taken any medication for it. Location of pain is under right ribcage. Denies SOB, numbness, or tingling.  He does admit to occasional binge drinking, will drink anywhere from 2-7 beers in one sitting.  He does not drink every day.  He did drink this weekend.  He denies any significant pain with high fat food consumption.  He denies constipation or changes to urination.  Overall with time, he feels as though his symptoms are getting worse.  He denies any left-sided chest pain, shortness of breath, palpitations, malaise.  Past Medical History:  Diagnosis Date  . Chronic kidney disease      Social History   Tobacco Use  . Smoking status: Former Smoker    Packs/day: 0.50    Types: Cigarettes    Start date: 03/03/1989    Quit date: 12/25/2017    Years since quitting: 2.4  . Smokeless tobacco: Never Used  Substance Use Topics  . Alcohol use: Yes    Alcohol/week: 0.0 standard drinks    Comment: twice weekly 5 drinks total  . Drug use: No    Past Surgical History:  Procedure Laterality Date  . KIDNEY CYST REMOVAL Left 1983    Family History  Problem Relation Age of Onset  . Anuerysm Father   . Colon cancer Neg Hx   . Prostate cancer Neg Hx     No Known Allergies  Current Medications:   Current Outpatient Medications:  .  allopurinol (ZYLOPRIM) 300 MG tablet, Take 1 tablet (300 mg total) by mouth 2 (two) times daily., Disp: 90 tablet, Rfl: 1 .  colchicine 0.6 MG  tablet, Take 1 tablet (0.6 mg total) by mouth daily., Disp: 30 tablet, Rfl: 12 .  HYDROcodone-acetaminophen (NORCO) 10-325 MG tablet, Take 1 tablet by mouth every 8 (eight) hours as needed. (Patient not taking: Reported on 06/21/2020), Disp: 20 tablet, Rfl: 0 .  indomethacin (INDOCIN) 50 MG capsule, Take 1 capsule (50 mg total) by mouth 3 (three) times daily with meals. (Patient not taking: Reported on 06/21/2020), Disp: 90 capsule, Rfl: 1   Review of Systems:   ROS Negative unless otherwise specified per HPI.  Vitals:   Vitals:   06/21/20 1505  BP: (!) 142/72  Pulse: 84  Temp: 98.4 F (36.9 C)  TempSrc: Temporal  SpO2: 98%  Weight: 214 lb 6.4 oz (97.3 kg)  Height: 5' 11.5" (1.816 m)     Body mass index is 29.49 kg/m.  Physical Exam:   Physical Exam Vitals and nursing note reviewed.  Constitutional:      General: He is not in acute distress.    Appearance: He is well-developed. He is not ill-appearing or toxic-appearing.  Cardiovascular:     Rate and Rhythm: Normal rate and regular rhythm.     Pulses: Normal pulses.     Heart sounds: Normal heart sounds, S1 normal and S2 normal.  Comments: No LE edema Pulmonary:     Effort: Pulmonary effort is normal.     Breath sounds: Normal breath sounds.  Abdominal:     General: Abdomen is flat. Bowel sounds are normal.     Palpations: Abdomen is soft.     Tenderness: There is no guarding or rebound. Negative signs include Murphy's sign and Rovsing's sign.     Comments: Slight pain with palpation under right anterior rib cage area  Skin:    General: Skin is warm and dry.  Neurological:     Mental Status: He is alert.     GCS: GCS eye subscore is 4. GCS verbal subscore is 5. GCS motor subscore is 6.  Psychiatric:        Speech: Speech normal.        Behavior: Behavior normal. Behavior is cooperative.       Assessment and Plan:   Elijah King was seen today for chest pain.  Diagnoses and all orders for this visit:  RUQ  abdominal pain -     EKG 12-Lead -     Comprehensive metabolic panel -     Lipase -     Lipid panel -     CBC with Differential/Platelet -     Urinalysis, Routine w reflex microscopic -     US Abdomen Limited RUQ; Future   EKG tracing is personally reviewed.  EKG notes NSR.  No acute changes.  Concern for musculoskeletal issue versus gallbladder versus pancreatitis versus gastritis versus other.  Will update labs and also obtain limited right upper quadrant ultrasound ASAP.  Patient was instructed to go to the emergency room if he has any worsening symptoms or any severe changes to his pain.  Patient verbalized understanding to plan.  I also recommended that he abstain from alcohol until we determine etiology of symptoms.   . Reviewed expectations re: course of current medical issues. . Discussed self-management of symptoms. . Outlined signs and symptoms indicating need for more acute intervention. . Patient verbalized understanding and all questions were answered. . See orders for this visit as documented in the electronic medical record. . Patient received an After-Visit Summary.  CMA or LPN served as scribe during this visit. History, Physical, and Plan performed by medical provider. The above documentation has been reviewed and is accurate and complete.  [name], PA-C

## 2020-06-22 ENCOUNTER — Ambulatory Visit (HOSPITAL_COMMUNITY)
Admission: RE | Admit: 2020-06-22 | Discharge: 2020-06-22 | Disposition: A | Discharge status: Home / Self Care | Payer: Commercial Managed Care - PPO | Source: Ambulatory Visit | Attending: Physician Assistant | Admitting: Physician Assistant

## 2020-06-22 ENCOUNTER — Other Ambulatory Visit: Payer: Self-pay

## 2020-06-22 DIAGNOSIS — R1011 Right upper quadrant pain: Secondary | ICD-10-CM | POA: Diagnosis present

## 2020-06-22 LAB — COMPREHENSIVE METABOLIC PANEL
ALT: 36 U/L (ref 0–53)
AST: 24 U/L (ref 0–37)
Albumin: 4.7 g/dL (ref 3.5–5.2)
Alkaline Phosphatase: 67 U/L (ref 39–117)
BUN: 22 mg/dL (ref 6–23)
CO2: 28 mEq/L (ref 19–32)
Calcium: 9.6 mg/dL (ref 8.4–10.5)
Chloride: 102 mEq/L (ref 96–112)
Creatinine, Ser: 1.3 mg/dL (ref 0.40–1.50)
GFR: 58.97 mL/min — ABNORMAL LOW (ref >60.00)
Glucose, Bld: 89 mg/dL (ref 70–99)
Potassium: 4.4 mEq/L (ref 3.5–5.1)
Sodium: 139 mEq/L (ref 135–145)
Total Bilirubin: 0.4 mg/dL (ref 0.2–1.2)
Total Protein: 7 g/dL (ref 6.0–8.3)

## 2020-06-22 LAB — CBC WITH DIFFERENTIAL/PLATELET
Basophils Absolute: 0.1 10*3/uL (ref 0.0–0.1)
Basophils Relative: 0.9 % (ref 0.0–3.0)
Eosinophils Absolute: 0.1 10*3/uL (ref 0.0–0.7)
Eosinophils Relative: 2 % (ref 0.0–5.0)
HCT: 43.1 % (ref 39.0–52.0)
Hemoglobin: 14.7 g/dL (ref 13.0–17.0)
Lymphocytes Relative: 23.4 % (ref 12.0–46.0)
Lymphs Abs: 1.4 10*3/uL (ref 0.7–4.0)
MCHC: 34.2 g/dL (ref 30.0–36.0)
MCV: 93.1 fl (ref 78.0–100.0)
Monocytes Absolute: 0.5 10*3/uL (ref 0.1–1.0)
Monocytes Relative: 8.5 % (ref 3.0–12.0)
Neutro Abs: 4 10*3/uL (ref 1.4–7.7)
Neutrophils Relative %: 65.2 % (ref 43.0–77.0)
Platelets: 172 10*3/uL (ref 150.0–400.0)
RBC: 4.63 Mil/uL (ref 4.22–5.81)
RDW: 14 % (ref 11.5–15.5)
WBC: 6.1 10*3/uL (ref 4.0–10.5)

## 2020-06-22 LAB — URINALYSIS, ROUTINE W REFLEX MICROSCOPIC
Bilirubin Urine: NEGATIVE
Hgb urine dipstick: NEGATIVE
Ketones, ur: NEGATIVE
Leukocytes,Ua: NEGATIVE
Nitrite: NEGATIVE
RBC / HPF: NONE SEEN (ref 0–?)
Specific Gravity, Urine: 1.02 (ref 1.000–1.030)
Total Protein, Urine: NEGATIVE
Urine Glucose: NEGATIVE
Urobilinogen, UA: 0.2 (ref 0.0–1.0)
WBC, UA: NONE SEEN (ref 0–?)
pH: 7 (ref 5.0–8.0)

## 2020-06-22 LAB — LIPID PANEL
Cholesterol: 205 mg/dL — ABNORMAL HIGH (ref 0–200)
HDL: 58.8 mg/dL (ref >39.00)
NonHDL: 146.51
Total CHOL/HDL Ratio: 3
Triglycerides: 257 mg/dL — ABNORMAL HIGH (ref 0.0–149.0)
VLDL: 51.4 mg/dL — ABNORMAL HIGH (ref 0.0–40.0)

## 2020-06-22 LAB — LDL CHOLESTEROL, DIRECT: Direct LDL: 121 mg/dL

## 2020-06-22 LAB — LIPASE: Lipase: 21 U/L (ref 11.0–59.0)

## 2020-07-06 ENCOUNTER — Ambulatory Visit (INDEPENDENT_AMBULATORY_CARE_PROVIDER_SITE_OTHER): Payer: Commercial Managed Care - PPO | Admitting: Family Medicine

## 2020-07-06 ENCOUNTER — Ambulatory Visit: Payer: Self-pay

## 2020-07-06 ENCOUNTER — Other Ambulatory Visit: Payer: Self-pay

## 2020-07-06 ENCOUNTER — Encounter: Payer: Self-pay | Admitting: Family Medicine

## 2020-07-06 VITALS — Ht 71.5 in

## 2020-07-06 DIAGNOSIS — M791 Myalgia, unspecified site: Secondary | ICD-10-CM

## 2020-07-06 DIAGNOSIS — M25562 Pain in left knee: Secondary | ICD-10-CM

## 2020-07-06 MED ORDER — HYDROCODONE-ACETAMINOPHEN 10-325 MG PO TABS: 1.0000 | ORAL_TABLET | Freq: Three times a day (TID) | ORAL | 0 refills | Status: DC | PRN

## 2020-07-06 NOTE — Patient Instructions (Signed)
Thank you for coming in today. Plan for injection and labs today.  Use hydrocodone for severe pain.  After labs are back if still not clear I will get a second opinion.  This may need a biopsy.

## 2020-07-06 NOTE — Progress Notes (Signed)
RICHARD RITCHEY is a 49 y.o. male who presents to [place] at [facility] today for follow up of L knee pain. Patient was last seen by Dr. Georgina Snell on 03/01/2020. At that time he had been having off huge amount of pain and dysfunction around his medial hamstring. He was treated for both gout with allopurinol and colchicine and for a Baker's cyst and pes anserine bursitis seen on MRI with injection. This worked pretty well and he was pain-free until this weekend. Pain started again Sunday and worsened dramatically yesterday. He notes that he is unable to walk normally and using a walker to ambulate. He notes his pain is severe and not responding to colchicine or over-the-counter medicines for pain.    Pertinent review of systems: No fevers or chills  Relevant historical information: History of gout    Exam:  Ht 5' 11.5" (1.816 m)   BMI 29.49 kg/m  General: Well Developed, well nourished, and in no acute distress.   MSK: Left knee swollen and tender along medial distal hamstring. Range of motion 10-100 degrees. Significant pain with resisted knee flexion. Capillary fill and sensation are intact distally from knee.    Lab and Radiology Results  Diagnostic Limited MSK Ultrasound of: Left knee Quad tendon intact normal-appearing Minimal joint effusion superior patellar space. Patellar tendon normal-appearing Medial hamstring tendon with soft tissue swelling surrounding hamstring tendon without clear isolated single Baker's cyst. Small pes anserine bursitis also present Impression: Hamstring tendinitis with soft tissue swelling question possible loculated Baker's cyst  Procedure: Real-time Ultrasound Guided Injection of left knee hamstring soft tissue swelling/Baker's cyst Device: Philips Affiniti 50G Images permanently stored and available for review in the ultrasound unit. Verbal informed consent obtained.  Discussed risks and benefits of procedure. Warned about  infection bleeding damage to structures skin hypopigmentation and fat atrophy among others. Patient expresses understanding and agreement Time-out conducted.   Noted no overlying erythema, induration, or other signs of local infection.   Skin prepped in a sterile fashion.   Local anesthesia: Topical Ethyl chloride.   With sterile technique and under real time ultrasound guidance:  4 mg of Kenalog and 2 mL of Marcaine injected easily.   Completed without difficulty   Pain partially immediately resolved suggesting accurate placement of the medication.   Advised to call if fevers/chills, erythema, induration, drainage, or persistent bleeding.   Images permanently stored and available for review in the ultrasound unit.  Impression: Technically successful ultrasound guided injection.        EXAM: MRI OF THE LEFT KNEE WITHOUT CONTRAST  TECHNIQUE: Multiplanar, multisequence MR imaging of the knee was performed. No intravenous contrast was administered.  COMPARISON:  Radiographs 12/29/2019  FINDINGS: MENISCI  Medial meniscus:  Intact with normal morphology.  Lateral meniscus:  Intact with normal morphology.  LIGAMENTS  Cruciates:  Intact.  Collaterals:  Intact.  CARTILAGE  Patellofemoral:  Preserved.  Medial: Mild chondral thinning and surface irregularity with mild subchondral cyst formation peripherally in the medial tibial plateau.  Lateral:  Preserved.  MISCELLANEOUS  Joint:  No significant joint effusion.  Popliteal Fossa: There is a complex Baker's cyst with surrounding soft tissue edema. Fluid extends into the pes anserine bursa.  Extensor Mechanism:  Intact.  Bones:  No acute or significant extra-articular osseous findings.  Other: No other significant periarticular soft tissue findings.  IMPRESSION: 1. Complex Baker's cyst with surrounding soft tissue edema and pes anserine bursitis. These findings may contribute to  posteromedial knee  pain. 2. No evidence of internal derangement. The menisci cruciate and collateral ligaments are intact. 3. Mild medial compartment degenerative changes.   Electronically Signed   By: Richardean Sale M.D.   On: 03/01/2020 08:15  I, Lynne Leader, personally (independently) visualized and performed the interpretation of the images attached in this note.   Assessment and Plan: 49 y.o. male with recurrent medial knee pain left knee.  Patient did great until just recently when his pain suddenly worsened.  His pain and presentation are atypical for gout or even Baker's cyst.  Plan to inject as above.  Additionally will check basic rheumatologic labs including CK CRP sed rate and uric acid.  Based on results of these labs will treat for rheumatologic condition and potentially refer to either rheumatology or orthopedic surgery.  My concern is that he has had this problem now several times and has a significant amount of pain and dysfunction as a result.  I think the fundamental issue is probably the Baker's cyst that becomes inflamed and entraps the hamstring tendon.   PDMP reviewed during this encounter. Orders Placed This Encounter  Procedures  . [place] LIMITED JOINT SPACE STRUCTURES LOW LEFT(NO LINKED CHARGES)    Order Specific Question:   Reason for Exam (SYMPTOM  OR DIAGNOSIS REQUIRED)    Answer:   eval knee pain    Order Specific Question:   Preferred imaging location?    Answer:   [place]  . Uric acid    Standing Status:   Future    Standing Expiration Date:   07/06/2021  . Sedimentation rate    Standing Status:   Future    Standing Expiration Date:   07/06/2021  . CK    Standing Status:   Future    Standing Expiration Date:   07/06/2021  . C-reactive protein    Standing Status:   Future    Standing Expiration Date:   07/06/2021   Meds ordered this encounter  Medications  . HYDROcodone-acetaminophen (NORCO) 10-325 MG tablet    Sig: Take  1 tablet by mouth every 8 (eight) hours as needed.    Dispense:  20 tablet    Refill:  0     Discussed warning signs or symptoms. Please see discharge instructions. Patient expresses understanding.   The above documentation has been reviewed and is accurate and complete Lynne Leader, M.D.

## 2020-07-07 ENCOUNTER — Other Ambulatory Visit: Payer: Commercial Managed Care - PPO

## 2020-07-07 DIAGNOSIS — M25562 Pain in left knee: Secondary | ICD-10-CM

## 2020-07-07 DIAGNOSIS — M791 Myalgia, unspecified site: Secondary | ICD-10-CM

## 2020-07-07 DIAGNOSIS — R7982 Elevated C-reactive protein (CRP): Secondary | ICD-10-CM

## 2020-07-08 ENCOUNTER — Telehealth: Payer: Self-pay | Admitting: Family Medicine

## 2020-07-08 ENCOUNTER — Encounter: Payer: Self-pay | Admitting: Family Medicine

## 2020-07-08 DIAGNOSIS — M25562 Pain in left knee: Secondary | ICD-10-CM

## 2020-07-08 LAB — CK: Total CK: 115 U/L (ref 44–196)

## 2020-07-08 LAB — URIC ACID: Uric Acid, Serum: 5.2 mg/dL (ref 4.0–8.0)

## 2020-07-08 LAB — C-REACTIVE PROTEIN: CRP: 58.4 mg/L — ABNORMAL HIGH (ref <8.0)

## 2020-07-08 LAB — SEDIMENTATION RATE: Sed Rate: 11 mm/h (ref 0–15)

## 2020-07-08 MED ORDER — PREDNISONE 10 MG (48) PO TBPK
ORAL_TABLET | Freq: Every day | ORAL | 0 refills | Status: DC
Start: 2020-07-08 — End: 2021-01-13

## 2020-07-08 NOTE — Telephone Encounter (Signed)
I have prescribed prednisone.  Please start taking it now as it should help the inflammation and pain. Schedule visit with me next week.

## 2020-07-08 NOTE — Telephone Encounter (Signed)
I will work on controlling pain until you can be seen by rheumatology.  That is probably the fastest place in region to get in.  However if you find a clinic they can get you in faster please let me know and I will be happy to place referral there.

## 2020-07-08 NOTE — Telephone Encounter (Signed)
Called pt and relayed Dr. Clovis Riley message.  Pt wants to know if he should go to the ED because he's having a hard time walking and is not able to work consistently due to the pain.  I advise him that Dr. Georgina Snell would likely want to see him again vs him going to the ED but inform pt that I will relay this info to Dr. Georgina Snell for him to determine next steps.  Please advise or call the pt.

## 2020-07-08 NOTE — Telephone Encounter (Signed)
Called and left detailed message regarding addition of prednisone that he is to take in conjunction w/ the hydrocodone-acetaminophen.  Also advise pt that he is to schedule a f/u visit w/ Dr. Georgina Snell for next week.

## 2020-07-08 NOTE — Telephone Encounter (Signed)
Pt called [facility] and they cannot get him in until August. He cannot handle the pain for that long and would like to get in somewhere sooner.

## 2020-07-08 NOTE — Progress Notes (Signed)
Uric acid level is normal.  CK a marker of muscle inflammation is normal Sedimentation rate a marker of general inflammation is normal.CRP another inflammatory marker is significantly elevated.  Based on this result I think it is reasonable to have referral to rheumatology help and figure out what is going on.

## 2020-07-09 ENCOUNTER — Encounter: Payer: Self-pay | Admitting: Orthopaedic Surgery

## 2020-07-09 ENCOUNTER — Ambulatory Visit: Payer: Commercial Managed Care - PPO | Admitting: Orthopaedic Surgery

## 2020-07-09 ENCOUNTER — Ambulatory Visit: Payer: Self-pay

## 2020-07-09 VITALS — BP 126/87 | HR 116 | Ht 72.0 in | Wt 215.0 lb

## 2020-07-09 DIAGNOSIS — M25562 Pain in left knee: Secondary | ICD-10-CM

## 2020-07-09 DIAGNOSIS — M7052 Other bursitis of knee, left knee: Secondary | ICD-10-CM | POA: Diagnosis not present

## 2020-07-09 DIAGNOSIS — M7050 Other bursitis of knee, unspecified knee: Secondary | ICD-10-CM

## 2020-07-12 DIAGNOSIS — M7050 Other bursitis of knee, unspecified knee: Secondary | ICD-10-CM | POA: Insufficient documentation

## 2020-07-12 DIAGNOSIS — M7052 Other bursitis of knee, left knee: Secondary | ICD-10-CM | POA: Diagnosis not present

## 2020-07-12 MED ORDER — METHYLPREDNISOLONE ACETATE 40 MG/ML IJ SUSP
40.0000 mg | INTRAMUSCULAR | Status: AC | PRN
  Administered 2020-07-12: 40 mg via INTRA_ARTICULAR

## 2020-07-12 MED ORDER — BUPIVACAINE HCL 0.5 % IJ SOLN
3.0000 mL | INTRAMUSCULAR | Status: AC | PRN
  Administered 2020-07-12: 3 mL via INTRA_ARTICULAR

## 2020-07-12 MED ORDER — LIDOCAINE HCL 1 % IJ SOLN
0.5000 mL | INTRAMUSCULAR | Status: AC | PRN
  Administered 2020-07-12: .5 mL

## 2020-07-12 NOTE — Progress Notes (Signed)
Office Visit Note   Patient: Elijah King           Date of Birth: [birth year]           MRN: [id number] Visit Date: 07/09/2020              Requested by: Gregor Hams, MD [place],  [place] [address] PCP: [name], [place]   Assessment & Plan: Visit Diagnoses:  1. Acute pain of left knee   2. Pes anserine bursitis     Plan: Pes bursa injection performed he likely has gotten in the pes bursa even though his uric acid is not elevated at this point.  Rheumatologic evaluation pending.  Long discussion about gout hyperuricemia.  With the elbows he has had an past history of being on and off allopurinol we discussed the importance of continue to take the allopurinol regularly and not stop and start it.  He needs to continue anti-inflammatories regularly.  He will follow-up if having ongoing symptoms.  Follow-Up Instructions: No follow-ups on file.   Orders:  Orders Placed This Encounter  Procedures  . XR Knee 1-2 Views Left   No orders of the defined types were placed in this encounter.     Procedures: Large Joint Inj: L knee on 07/12/2020 7:26 PM Indications: joint swelling and pain Details: 22 G 1.5 in needle, anterolateral approach  Arthrogram: No  Medications: 0.5 mL lidocaine 1 %; 3 mL bupivacaine 0.5 %; 40 mg methylPREDNISolone acetate 40 MG/ML Outcome: tolerated well, no immediate complications Procedure, treatment alternatives, risks and benefits explained, specific risks discussed. Consent was given by the patient. Immediately prior to procedure a time out was called to verify the correct patient, procedure, equipment, support staff and site/side marked as required. Patient was prepped and draped in the usual sterile fashion.       Clinical Data: No additional findings.   Subjective: Chief Complaint  Patient presents with  . Left Knee - Pain    HPI 49 year old male with significant history of gout 5 years ago uric acid was 8.8 has been on  allopurinol 300 mg last uric acid on 07/07/2020 was 5.2.  Patient seen with left knee pain problems with posterior medial swelling and tenderness with difficulty walking.  He has had gout in his feet and his ankles.  Problems on his olecranon bursa in the past both right and left.  Currently is having difficulty walking has had to miss some work.  No fever chills no history of a specific trauma to his left knee.  He points to the medial aspect of his knee where he is having most pain and swelling.  Sed rate was normal CRP was elevated at 58.4.  Rheumatology referral is pending.  Patient had colchicine.  Hydrocodone for his pain past history of Indocin.  Recent prednisone 12-day Dosepak started which is improved his symptoms.  Review of Systems all other systems are negative as pertains HPI.  No iritis no uveitis.  No swallowing problems.  No rash.   Objective: Vital Signs: BP 126/87 (BP Location: Left Arm, Patient Position: Sitting, Cuff Size: Normal)   Pulse (!) 116   Ht 6' (1.829 m)   Wt 215 lb (97.5 kg)   BMI 29.16 kg/m   Physical Exam Constitutional:      Appearance: He is well-developed.  HENT:     Head: Normocephalic and atraumatic.  Eyes:     Pupils: Pupils are equal, round, and reactive to  light.  Neck:     Thyroid: No thyromegaly.     Trachea: No tracheal deviation.  Cardiovascular:     Rate and Rhythm: Normal rate.  Pulmonary:     Effort: Pulmonary effort is normal.     Breath sounds: No wheezing.  Abdominal:     General: Bowel sounds are normal.     Palpations: Abdomen is soft.  Skin:    General: Skin is warm and dry.     Capillary Refill: Capillary refill takes less than 2 seconds.  Neurological:     Mental Status: He is alert and oriented to person, place, and time.  Psychiatric:        Behavior: Behavior normal.        Thought Content: Thought content normal.        Judgment: Judgment normal.     Ortho Exam Patient has tenderness over the pes bursa extends  posterior medially over the semitendinosis and gracilis.  He has some tenderness of the medial joint line no plica tenderness no significant knee effusion at this point.  Prominence of right left olecranon bursa without evidence of active acute inflammation. Specialty Comments:  No specialty comments available.  Imaging: No results found.   PMFS History: Patient Active Problem List   Diagnosis Date Noted  . Pes anserine bursitis 07/12/2020  . Elevated blood pressure reading 03/28/2017  . Snoring 03/28/2017  . 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 Chronic kidney disease 03/28/2017  . Former smoker 03/28/2017  . Obesity (BMI 30-39.9) 03/28/2017  . Gastroesophageal reflux disease 03/28/2017  . 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 Chronic kidney disease     Family History  Problem Relation Age of Onset  . Anuerysm Father   . Colon cancer Neg Hx   . Prostate cancer Neg Hx     Past Surgical History:  Procedure Laterality Date  . KIDNEY CYST REMOVAL Left 1983   Social History   Occupational History  . Not on file  Tobacco Use  . Smoking status: Former Smoker    Packs/day: 0.50    Types: Cigarettes    Start date: 03/03/1989    Quit date: 12/25/2017    Years since quitting: 2.5  . Smokeless tobacco: Never Used  Substance and Sexual Activity  . Alcohol use: Yes    Alcohol/week: 0.0 standard drinks    Comment: twice weekly 5 drinks total  . Drug use: No  . Sexual activity: Yes

## 2020-09-06 ENCOUNTER — Telehealth: Payer: Self-pay | Admitting: Family Medicine

## 2020-09-06 NOTE — Telephone Encounter (Signed)
Pt called for refill of allopurinol to [facility].

## 2020-09-07 MED ORDER — ALLOPURINOL 300 MG PO TABS: 300.0000 mg | ORAL_TABLET | Freq: Two times a day (BID) | ORAL | 3 refills | Status: DC

## 2020-09-07 NOTE — Telephone Encounter (Signed)
Refilled

## 2021-01-12 NOTE — Progress Notes (Signed)
I, Elijah King, LAT, ATC, am serving as scribe for Elijah King.  Elijah King is a 50 y.o. male who presents to [place] [facility] today for f/u L knee pain. Pt was last seen by Elijah King on 07/06/20 and had a L knee/Baker's cyst injection.  He was referred to rheumatology and orthopedic surgery who administered a pes anserine steroid injection on 07/09/20. Pt has a significant history of gout. Today, pt reports increased L knee pain and swelling x 2 days w/ no new MOI.  He states that he was relatively fine until 2 days ago when the pain dramatically increased.  He has been taking Allopurinol but does not have any colchicine.  He is currently walking w/ a walker due to severe pain w/ attempts at weight bearing through his L LE.  He notes he was never able to be seen by rheumatology.  He is interested in a different rheumatology referral if possible as his pain has returned.  Dx imaging: 07/09/20 L knee XR  02/28/20 L knee MRI  Pertinent review of systems: No fevers or chills  Relevant historical information: Gout   Exam:  BP 112/78 (BP Location: Left Arm, Patient Position: Sitting, Cuff Size: Large)   Pulse (!) 120   Ht 6' (1.829 m)   Wt 215 lb (97.5 kg)   SpO2 98%   BMI 29.16 kg/m  General: Well Developed, well nourished, and in no acute distress.   MSK: Left knee swelling at the medial aspect of the knee tender palpation.  Decreased motion.  Guarding with ligament exam testing and strength exam testing nondiagnostic.  Antalgic gait using a walker to ambulate.    Lab and Radiology Results  Procedure: Real-time Ultrasound Guided Injection of left knee hypoechoic fluid tracking within and around the hamstring tendon at medial joint line Device: Philips Affiniti 50G Images permanently stored and available for review in PACS Verbal informed consent obtained.  Discussed risks and benefits of procedure. Warned about infection bleeding damage to structures skin  hypopigmentation and fat atrophy among others. Patient expresses understanding and agreement Time-out conducted.   Noted no overlying erythema, induration, or other signs of local infection.   Skin prepped in a sterile fashion.   Local anesthesia: Topical Ethyl chloride.   With sterile technique and under real time ultrasound guidance:  40 mg of Kenalog and 2 L of Marcaine injected into hypoechoic fluid swelling. Fluid seen entering the hypoechoic fluid.   Completed without difficulty   Pain immediately resolved suggesting accurate placement of the medication.   Advised to call if fevers/chills, erythema, induration, drainage, or persistent bleeding.   Images permanently stored and available for review in the ultrasound unit.  Impression: Technically successful ultrasound guided injection.   Lab Results  Component Value Date   LABURIC 5.2 07/07/2020   Lab Results  Component Value Date   CRP 58.4 (H) 07/07/2020      Assessment and Plan: 50 y.o. male with left knee pain and swelling.  At this point it still a bit of a mystery of why this keeps happening to [name].  I think it is entirely possible that this is a rheumatologic condition.  We will refer him to rheumatology for a second opinion on this.  His gout seems to be well controlled.  We will go ahead and proceed with injection today into the area of swelling and tenderness as this has worked well in the past.  Also will refill his  colchicine as that has been somewhat helpful in the past as well.  We will refill the hydrocodone for immediate pain control.  Check back with me as needed.   PDMP reviewed during this encounter. Orders Placed This Encounter  Procedures  . [place] LIMITED JOINT SPACE STRUCTURES LOW LEFT(NO LINKED CHARGES)    Order Specific Question:   Reason for Exam (SYMPTOM  OR DIAGNOSIS REQUIRED)    Answer:   L knee pain    Order Specific Question:   Preferred imaging location?    Answer:   [place]  . Ambulatory referral to Rheumatology    Referral Priority:   Routine    Referral Type:   Consultation    Referral Reason:   Specialty Services Required    Referred to Provider:   Collier Salina, King    Requested Specialty:   Rheumatology    Number of Visits Requested:   1   Meds ordered this encounter  Medications  . colchicine 0.6 MG tablet    Sig: Take 1 tablet (0.6 mg total) by mouth daily.    Dispense:  30 tablet    Refill:  12  . HYDROcodone-acetaminophen (NORCO) 10-325 MG tablet    Sig: Take 1 tablet by mouth every 8 (eight) hours as needed.    Dispense:  15 tablet    Refill:  0     Discussed warning signs or symptoms. Please see discharge instructions. Patient expresses understanding.   The above documentation has been reviewed and is accurate and complete Elijah King, M.D.

## 2021-01-13 ENCOUNTER — Ambulatory Visit (INDEPENDENT_AMBULATORY_CARE_PROVIDER_SITE_OTHER): Payer: Commercial Managed Care - PPO | Admitting: Family Medicine

## 2021-01-13 ENCOUNTER — Ambulatory Visit: Payer: Self-pay

## 2021-01-13 ENCOUNTER — Encounter: Payer: Self-pay | Admitting: Family Medicine

## 2021-01-13 ENCOUNTER — Other Ambulatory Visit: Payer: Self-pay

## 2021-01-13 VITALS — BP 112/78 | HR 120 | Ht 72.0 in | Wt 215.0 lb

## 2021-01-13 DIAGNOSIS — M25562 Pain in left knee: Secondary | ICD-10-CM

## 2021-01-13 DIAGNOSIS — M109 Gout, unspecified: Secondary | ICD-10-CM

## 2021-01-13 DIAGNOSIS — M791 Myalgia, unspecified site: Secondary | ICD-10-CM | POA: Diagnosis not present

## 2021-01-13 MED ORDER — HYDROCODONE-ACETAMINOPHEN 10-325 MG PO TABS: 1.0000 | ORAL_TABLET | Freq: Three times a day (TID) | ORAL | 0 refills | Status: DC | PRN

## 2021-01-13 MED ORDER — COLCHICINE 0.6 MG PO TABS: 0.6000 mg | ORAL_TABLET | Freq: Every day | ORAL | 12 refills | Status: DC

## 2021-01-13 NOTE — Patient Instructions (Addendum)
You had a L knee injection today.  Call or go to the ER if you develop a large red swollen joint with extreme pain or oozing puss.   You should hear from Rheumatology soon.   Let me know if you do not hear anything soon.   Recheck as needed.   Use the hydrocodone sparingly.   Take the colchicine daily for a few days now and then only as needed.

## 2021-01-18 ENCOUNTER — Ambulatory Visit: Payer: Commercial Managed Care - PPO | Admitting: Orthopaedic Surgery

## 2021-01-25 NOTE — Progress Notes (Signed)
Office Visit Note  Patient: Elijah King             Date of Birth: December 17, 1971           MRN: [id number]             PCP: [name], PA Referring: Gregor Hams, MD Visit Date: 01/26/2021   Subjective:  New Patient (Initial Visit), Joint Pain (Patient complains of left knee pain which was previously swollen but not currently. ), and Gout (Patient complains of gout symptoms currently in the right great toe. Patient has a minor gout flare at least once a month and major flares every 3 months or so. )   History of Present Illness: Elijah King is a 50 y.o. male here for evaluation of left knee pain and swelling which has been ongoing intermittently since more than a year ago. He previously had gout for years involving the great toe, feet, knees, and elbows but has been on allopurinol now for years with uric acid remaining at goal with a current daily dose of $Remov'600mg'HeJDnm$ . He takes colchicine sometimes as needed also indomethacin with flares. He was treated with intraarticular steroid injection twice for persistent symptoms in the left knee flaring badly about every 3 months. These are quite helpful but symptoms quickly return. He currently has some erythema and swelling around the right great toe, which is very common for him. He has not noticed any skin rashes, nail changes, eye inflammation, fevers, or lymphadenopathy. He does have mildly impaired GFR with previous removal of renal cyst surgery.  Labs reviewed 06/2020 Uric acid 5.2 CK 115 ESR 11  CRP 58.4  Imaging reviewed 02/2020 MRI L knee IMPRESSION: 1. Complex Baker's cyst with surrounding soft tissue edema and pes anserine bursitis. These findings may contribute to posteromedial knee pain. 2. No evidence of internal derangement. The menisci cruciate and collateral ligaments are intact. 3. Mild medial compartment degenerative changes.  08/2016 Xray right foot 1st metatarsal head erosion on medial side  Activities of Daily  Living:  Patient reports morning stiffness for 1 hour.   Patient Denies nocturnal pain.  Difficulty dressing/grooming: Denies Difficulty climbing stairs: Denies Difficulty getting out of chair: Denies Difficulty using hands for taps, buttons, cutlery, and/or writing: Denies  Review of Systems  Constitutional: Negative for fatigue.  HENT: Negative for mouth sores, mouth dryness and nose dryness.   Eyes: Negative for pain, itching, visual disturbance and dryness.  Respiratory: Negative for cough, hemoptysis, shortness of breath and difficulty breathing.   Cardiovascular: Negative for chest pain, palpitations and swelling in legs/feet.  Gastrointestinal: Negative for abdominal pain, blood in stool, constipation and diarrhea.  Endocrine: Negative for increased urination.  Genitourinary: Negative for painful urination.  Musculoskeletal: Positive for arthralgias, joint pain and morning stiffness. Negative for joint swelling, myalgias, muscle weakness, muscle tenderness and myalgias.  Skin: Negative for color change, rash and redness.  Allergic/Immunologic: Negative for susceptible to infections.  Neurological: Negative for dizziness, numbness, headaches, memory loss and weakness.  Hematological: Negative for swollen glands.  Psychiatric/Behavioral: Negative for confusion and sleep disturbance.    PMFS History:  Patient Active Problem List   Diagnosis Date Noted  . Pes anserine bursitis 07/12/2020  . Elevated blood pressure reading 03/28/2017  . Snoring 03/28/2017  . 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 Chronic kidney disease 03/28/2017  . Former smoker 03/28/2017  . Obesity (BMI 30-39.9) 03/28/2017  . Gastroesophageal reflux disease 03/28/2017  . 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 Chronic kidney disease   . Gout     Family History  Problem Relation Age of Onset  . Anuerysm Father   . Colon cancer Neg Hx   . Prostate cancer Neg Hx     Past Surgical History:  Procedure Laterality Date  . KIDNEY CYST REMOVAL Left 1983   Social History   Social History Narrative   Has a desk job   Live in [place]   Married, 1 child (son)   Immunization History  Administered Date(s) Administered  . Influenza Split 01/02/2013  . Influenza,inj,Quad PF,6+ Mos 09/13/2018  . Influenza-Unspecified 09/24/2019  . Tdap 03/28/2017     Objective: Vital Signs: BP 125/82 (BP Location: Right Arm, Patient Position: Sitting, Cuff Size: Normal)   Pulse 78   Ht 6' (1.829 m)   Wt 222 lb 6.4 oz (100.9 kg)   BMI 30.16 kg/m    Physical Exam HENT:     Right Ear: External ear normal.     Left Ear: External ear normal.     Mouth/Throat:     Mouth: Mucous membranes are moist.     Pharynx: Oropharynx is clear.  Eyes:     Conjunctiva/sclera: Conjunctivae normal.  Cardiovascular:     Rate and Rhythm: Normal rate and regular rhythm.  Pulmonary:     Effort: Pulmonary effort is normal.     Breath sounds: Normal breath sounds.  Skin:    General: Skin is warm and dry.     Findings: No rash.     Comments: Linear nail ridging more pronounced toes than fingers no pitting or cracking  Neurological:     Mental Status: He is alert.      Musculoskeletal Exam:  Neck full range of motion Shoulder, elbow, wrist, fingers full range of motion no tenderness or swelling No paraspinal tenderness to palpation over upper and lower back Normal hip internal and external rotation without pain, no tenderness to lateral hip palpation Knees normal ROM no current swelling or tenderness to palpation, no laxity and norma strength Ankles full range of motion no tenderness or swelling, left achilles tendon nodule present nontender Right 1st MTP overlying erythema and mild warmth and swelling, not too painful, rest MTPs normal   Investigation: No additional findings.  Imaging: [place] LIMITED JOINT SPACE STRUCTURES LOW LEFT(NO LINKED CHARGES)  Result Date:  01/25/2021 Procedure: Real-time Ultrasound Guided Injection of left knee hypoechoic fluid tracking within and around the hamstring tendon at medial joint line Device: Philips Affiniti 50G Images permanently stored and available for review in PACS Verbal informed consent obtained. Discussed risks and benefits of procedure. Warned about infection bleeding damage to structures skin hypopigmentation and fat atrophy among others. Patient expresses understanding and agreement Time-out conducted.  Noted no overlying erythema, induration, or other signs of local infection.  Skin prepped in a sterile fashion.  Local anesthesia: Topical Ethyl chloride.  With sterile technique and under real time ultrasound guidance: 40 mg of Kenalog and 2 L of Marcaine injected into hypoechoic fluid swelling. Fluid seen entering the hypoechoic fluid.  Completed without difficulty  Pain immediately resolved suggesting accurate placement of the medication.  Advised to call if fevers/chills, erythema, induration, drainage, or persistent bleeding.  Images permanently stored and available for review in the ultrasound unit. Impression: Technically successful ultrasound guided injection.   Recent Labs: Lab Results  Component Value Date   WBC 6.1 06/21/2020   HGB 14.7 06/21/2020   PLT 172.0 06/21/2020  NA 139 06/21/2020   K 4.4 06/21/2020   CL 102 06/21/2020   CO2 28 06/21/2020   GLUCOSE 89 06/21/2020   BUN 22 06/21/2020   CREATININE 1.30 06/21/2020   BILITOT 0.4 06/21/2020   ALKPHOS 67 06/21/2020   AST 24 06/21/2020   ALT 36 06/21/2020   PROT 7.0 06/21/2020   ALBUMIN 4.7 06/21/2020   CALCIUM 9.6 06/21/2020   GFRAA 81 05/13/2016    Speciality Comments: No specialty comments available.  Procedures:  No procedures performed Allergies: Patient has no known allergies.   Assessment / Plan:     Visit Diagnoses: Polyarticular gout - Plan: Uric acid  Certainly chronic, polyarticular gout his toe is extremely  characteristic for this and reviewed past xray with a typical looking erosion. The knee could have alternate process since it seems more refractory than other joint involvements. Minimal baker's cyst accumulation at this time. Some suspicion for alternate inflammatory arthritis due to amount of disease despite normal uric acid levels. Nail changes and achilles tendon nodules could suggest seronegative disease.  At this time recommend repeat uric acid to ensure is indeed at goal. Otherwise continue current treatment and would like to see him back when knee pain and swelling come back for possible aspiration or at least serologic testing.  Orders: Orders Placed This Encounter  Procedures  . Uric acid   No orders of the defined types were placed in this encounter.   Follow-Up Instructions: Return in about 3 months (around 04/25/2021) for gout f/u.   Collier Salina, MD  Note - This record has been created using [employer].  Chart creation errors have been sought, but may not always  have been located. Such creation errors do not reflect on  the standard of medical care.

## 2021-01-26 ENCOUNTER — Other Ambulatory Visit: Payer: Self-pay

## 2021-01-26 ENCOUNTER — Encounter: Payer: Self-pay | Admitting: Internal Medicine

## 2021-01-26 ENCOUNTER — Ambulatory Visit: Payer: Commercial Managed Care - PPO | Admitting: Internal Medicine

## 2021-01-26 VITALS — BP 125/82 | HR 78 | Ht 72.0 in | Wt 222.4 lb

## 2021-01-26 DIAGNOSIS — M109 Gout, unspecified: Secondary | ICD-10-CM | POA: Diagnosis not present

## 2021-01-26 NOTE — Patient Instructions (Addendum)
Your symptoms appear highly consistent with polyarticular gout so I am not sure why they are recurrent despite adequate allopurinol dosing based on uric acid levels.  Alternate causes of inflammatory joint pain are also possible, but I would like to see you again when symptoms are increasing again, you can call the clinic to arrange an appointment on short notice whenever you feel this is coming back on.

## 2021-01-27 LAB — URIC ACID: Uric Acid, Serum: 3.2 mg/dL — ABNORMAL LOW (ref 4.0–8.0)

## 2021-01-27 NOTE — Progress Notes (Signed)
Uric acid is 3.2 this is suppressed even below the normal value without gout. I am highly suspicious to look for alternative causes, plan to follow up when he has another flare to lab testing and joint aspiration if possible.

## 2021-03-03 ENCOUNTER — Encounter: Payer: Self-pay | Admitting: Internal Medicine

## 2021-03-03 ENCOUNTER — Other Ambulatory Visit: Payer: Self-pay

## 2021-03-03 ENCOUNTER — Ambulatory Visit: Payer: Commercial Managed Care - PPO | Admitting: Internal Medicine

## 2021-03-03 VITALS — BP 132/87 | HR 98 | Ht 72.0 in | Wt 220.0 lb

## 2021-03-03 DIAGNOSIS — M25422 Effusion, left elbow: Secondary | ICD-10-CM | POA: Diagnosis not present

## 2021-03-03 DIAGNOSIS — M109 Gout, unspecified: Secondary | ICD-10-CM

## 2021-03-03 LAB — SYNOVIAL FLUID ANALYSIS, COMPLETE
Basophils, %: 0 %
Eosinophils-Synovial: 0 % (ref 0–2)
Lymphocytes-Synovial Fld: 37 % (ref 0–74)
Monocyte/Macrophage: 3 % (ref 0–69)
Neutrophil, Synovial: 60 % — ABNORMAL HIGH (ref 0–24)
Synoviocytes, %: 0 % (ref 0–15)
WBC, Synovial: 7022 cells/uL — ABNORMAL HIGH (ref <150)

## 2021-03-03 NOTE — Progress Notes (Signed)
Office Visit Note  Patient: Elijah King             Date of Birth: [birth year]           MRN: [id number]             PCP: [name], PA Referring: [name], PA Visit Date: 03/03/2021   Subjective:  Other (Left elbow pain- onset yesterday. )   History of Present Illness: Elijah King is a 50 y.o. male here for onset of left elbow pain and swelling since 1 day ago. He denies any provocative injury. 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 He is interested in aspiration and injection as discussed at last visit.  Review of Systems  Constitutional: Positive for fatigue.  HENT: Negative for mouth sores, mouth dryness and nose dryness.   Eyes: Negative for pain, itching and dryness.  Respiratory: Negative for shortness of breath and difficulty breathing.   Cardiovascular: Negative for chest pain and palpitations.  Gastrointestinal: Negative for blood in stool, constipation and diarrhea.  Endocrine: Negative for increased urination.  Genitourinary: Negative for difficulty urinating.  Musculoskeletal: Positive for arthralgias, joint pain, joint swelling and morning stiffness. Negative for myalgias, muscle tenderness and myalgias.  Skin: Negative for color change, rash and redness.  Allergic/Immunologic: Negative for susceptible to infections.  Neurological: Negative for dizziness, numbness, headaches, memory loss and weakness.  Hematological: Negative for bruising/bleeding tendency.  Psychiatric/Behavioral: Negative for confusion.    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    Past Medical History:  Diagnosis Date   Chronic kidney disease    Gout     Family History  Problem Relation Age of Onset   Anuerysm Father    Colon cancer Neg Hx    Prostate cancer Neg Hx    Past Surgical History:  Procedure Laterality Date   KIDNEY CYST REMOVAL Left 1983   Social History   Social History Narrative   Has a desk job   Live in [place]   Married, 1 child (son)   Immunization History  Administered Date(s) Administered   Influenza Split 01/02/2013   Influenza,inj,Quad PF,6+ Mos 09/13/2018   Influenza-Unspecified 09/24/2019   Tdap 03/28/2017     Objective: Vital Signs: BP 132/87 (BP Location: Right Arm, Patient Position: Sitting, Cuff Size: Normal)    Pulse 98    Ht 6' (1.829 m)    Wt 220 lb (99.8 kg)    BMI 29.84 kg/m    Physical Exam Constitutional:      Appearance: Normal appearance.  Neurological:     General: No focal deficit present.     Mental Status: He is alert.  Psychiatric:        Mood and Affect: Mood normal.     Musculoskeletal Exam:  Erythema, warmth, swelling over left arm olecranon bursa, no palpable nodules   Investigation: No additional findings.  Imaging: No results found.  Recent Labs: Lab Results  Component Value Date   WBC 6.1 06/21/2020   HGB 14.7 06/21/2020   PLT 172.0 06/21/2020   NA 139 06/21/2020  K 4.4 06/21/2020   CL 102 06/21/2020   CO2 28 06/21/2020   GLUCOSE 89 06/21/2020   BUN 22 06/21/2020   CREATININE 1.30 06/21/2020   BILITOT 0.4 06/21/2020   ALKPHOS 67 06/21/2020   AST 24 06/21/2020   ALT 36 06/21/2020   PROT 7.0 06/21/2020   ALBUMIN 4.7 06/21/2020   CALCIUM 9.6 06/21/2020   GFRAA 81 05/13/2016    Speciality Comments: No specialty comments available.  Procedures:  Medium Joint Inj: L olecranon bursa on 03/03/2021 11:50 AM Indications: pain, joint swelling and diagnostic evaluation Details: 25 G 1.5 in needle, anterior approach Medications: 1 mL  lidocaine 1 %; 20 mg triamcinolone acetonide 40 MG/ML Aspirate: yellow; sent for lab analysis Outcome: tolerated well, no immediate complications Consent was given by the patient. Immediately prior to procedure a time out was called to verify the correct patient, procedure, equipment, support staff and site/side marked as required. Patient was prepped and draped in the usual sterile fashion.     Allergies: Patient has no known allergies.   Assessment / Plan:     Visit Diagnoses: Polyarticular gout Elbow swelling, left - Plan: Synovial Fluid Analysis, Complete  Aspiration of left olecranon bursa  He has olecranon bursitis of the left elbow now. This seems unlikely to represent gout given his well controlled uric acid but no other specific history to explain it. If negative for MSU would suggest maybe seronegative inflammatory arthritis ?PsA.  Orders: Orders Placed This Encounter  Procedures   Synovial Fluid Analysis, Complete   No orders of the defined types were placed in this encounter.    Follow-Up Instructions: No follow-ups on file.   Collier Salina, MD  Note - This record has been created using [employer].  Chart creation errors have been sought, but may not always  have been located. Such creation errors do not reflect on  the standard of medical care.

## 2021-03-04 MED ORDER — LIDOCAINE HCL 1 % IJ SOLN
1.0000 mL | INTRAMUSCULAR | Status: AC | PRN
  Administered 2021-03-03: 1 mL

## 2021-03-04 MED ORDER — TRIAMCINOLONE ACETONIDE 40 MG/ML IJ SUSP
20.0000 mg | INTRAMUSCULAR | Status: AC | PRN
  Administered 2021-03-03: 20 mg via INTRA_ARTICULAR

## 2021-03-04 NOTE — Progress Notes (Signed)
I spoke with Elijah King, his fluid shows no uric acid crystals but does look inflammatory. This suggests gout is not the cause, I recommend his current symptoms should hopefully continue improving but we can follow up either in several weeks or if another joint swells up maybe a seronegative arthritis.

## 2021-03-28 ENCOUNTER — Telehealth: Payer: Commercial Managed Care - PPO | Admitting: Physician Assistant

## 2021-03-28 ENCOUNTER — Other Ambulatory Visit: Payer: Self-pay

## 2021-03-28 ENCOUNTER — Telehealth: Payer: Self-pay | Admitting: *Deleted

## 2021-03-28 ENCOUNTER — Other Ambulatory Visit: Payer: Self-pay | Admitting: *Deleted

## 2021-03-28 DIAGNOSIS — Z1211 Encounter for screening for malignant neoplasm of colon: Secondary | ICD-10-CM

## 2021-03-28 NOTE — Telephone Encounter (Signed)
Spoke to pt, asked him if he just needed order for Colonoscopy? Pt said yes, it is time for one. Told pt does not need to be seen I can just place a referral for you and also you are due for physical. Pt verbalized understanding and said he can schedule now. Told pt okay I have Monday available. Pt said that is fine. Appt scheduled. Told pt will cancel appt for today and place referral someone will contact you to schedule an appt. Pt verbalized understanding.

## 2021-03-30 ENCOUNTER — Inpatient Hospital Stay: Payer: Commercial Managed Care - PPO

## 2021-03-30 ENCOUNTER — Inpatient Hospital Stay: Payer: Commercial Managed Care - PPO | Attending: Genetic Counselor | Admitting: Genetic Counselor

## 2021-03-30 ENCOUNTER — Other Ambulatory Visit: Payer: Self-pay

## 2021-03-30 DIAGNOSIS — Z8481 Family history of carrier of genetic disease: Secondary | ICD-10-CM

## 2021-03-30 DIAGNOSIS — Z801 Family history of malignant neoplasm of trachea, bronchus and lung: Secondary | ICD-10-CM

## 2021-03-30 DIAGNOSIS — Z8041 Family history of malignant neoplasm of ovary: Secondary | ICD-10-CM

## 2021-03-30 DIAGNOSIS — Z803 Family history of malignant neoplasm of breast: Secondary | ICD-10-CM

## 2021-03-30 LAB — GENETIC SCREENING ORDER

## 2021-03-31 ENCOUNTER — Encounter: Payer: Self-pay | Admitting: Genetic Counselor

## 2021-03-31 DIAGNOSIS — Z803 Family history of malignant neoplasm of breast: Secondary | ICD-10-CM | POA: Insufficient documentation

## 2021-03-31 DIAGNOSIS — Z8041 Family history of malignant neoplasm of ovary: Secondary | ICD-10-CM | POA: Insufficient documentation

## 2021-03-31 DIAGNOSIS — Z8481 Family history of carrier of genetic disease: Secondary | ICD-10-CM | POA: Insufficient documentation

## 2021-03-31 NOTE — Progress Notes (Signed)
REFERRING PROVIDER: [name], [address],  [place] [address]  PRIMARY PROVIDER:  [name], [place]  PRIMARY REASON FOR VISIT:  1. Family history of genetic disease carrier   2. Family history of breast cancer   3. Family history of ovarian cancer      HISTORY OF PRESENT ILLNESS:   [name], a 50 y.o. male, was seen for a [facility] cancer genetics consultation at his personal request due to his mother being diagnosed as a carrier for a pathogenic variant in the [place] gene which increases the risk for Fumerate Hydratase Deficiency.  Mr. Ackert presents to clinic today to discuss the possibility of a hereditary predisposition to cancer, genetic testing, and to further clarify his future cancer risks, as well as potential cancer risks for family members.   Mr. Renne is a 50 y.o. male with no personal history of cancer.    CANCER HISTORY:  Oncology History   No history exists.      Past Medical History:  Diagnosis Date  . Chronic kidney disease   . Family history of genetic disease carrier   . Gout     Past Surgical History:  Procedure Laterality Date  . KIDNEY CYST REMOVAL Left 1983    Social History   Socioeconomic History  . Marital status: Married    Spouse name: Not on file  . Number of children: Not on file  . Years of education: Not on file  . Highest education level: Not on file  Occupational History  . Not on file  Tobacco Use  . Smoking status: Former Smoker    Packs/day: 0.50    Types: Cigarettes    Start date: 03/03/1989    Quit date: 12/25/2017    Years since quitting: 3.2  . Smokeless tobacco: Never Used  Vaping Use  . Vaping Use: Never used  Substance and Sexual Activity  . Alcohol use: Yes    Alcohol/week: 0.0 standard drinks    Comment: twice weekly 5 drinks total  . Drug use: No  . Sexual activity: Yes  Other Topics Concern  . Not on file  Social History Narrative   Has a desk job   Live in [place]   Married, 1 child  (son)   Social Determinants of Health   Financial Resource Strain: Not on file  Food Insecurity: Not on file  Transportation Needs: Not on file  Physical Activity: Not on file  Stress: Not on file  Social Connections: Not on file     FAMILY HISTORY:  We obtained a detailed, 4-generation family history.  Significant diagnoses are listed below: Family History  Problem Relation Age of Onset  . Anuerysm Father   . Lung cancer Father   . Breast cancer Maternal Aunt   . Ovarian cancer Maternal Aunt   . Breast cancer Other        MGMs mother dx in her 49s  . Colon cancer Neg Hx   . Prostate cancer Neg Hx      The patient has one son who is cancer free.  He has a brother who is cancer free.  Both parents are living.  The patient's father had lung cancer.  He has one brother who is cancer free.  His parents are deceased from non-cancer related issues.  The patient's mother is living and cancer free.  She had genetic testing in the past and was found to be a carrier of Fumerate Hydratase Deficiency.  She has  a sister who had both breast and ovarian cancer.  Her parents are deceased from non-cancer related issues.  Her mother was adopted, but her biological mother died in her 28's from breast cancer.  Mr. Brammer is aware of previous family history of genetic testing for hereditary cancer risks. Patient's maternal ancestors are of European descent, and paternal ancestors are of European descent. There is no reported Ashkenazi Jewish ancestry. There is no known consanguinity.  GENETIC COUNSELING ASSESSMENT: Mr. Valladolid is a 50 y.o. male with a family history of a known carrier condition which is somewhat suggestive of a risk for Fumerate Hydratase Deficiency. We, therefore, discussed and recommended the following at today's visit.   DISCUSSION: Fumerate Hydratase Deficiency (FHD) is an inborn error of metabolism condition that is inherited in an autosomal recessive fashion.  Carriers of this  condition are not at risk for cancer or other conditions, but do have an increased risk for having a child with FHD.  FHD is caused by pathogenic mutations in the [place] gene.  Some pathogenic mutations in this gene can increase the risk for Hereditary Leiomyomatosis and Renal Cell Carcinoma (HLRCC), while others simply increase the risk for FHD.  The pathogenic mutation in Mr. Minton family is one that is well known to only increase the risk for FHD, and does not increase the risk for cancer.  We reviewed the characteristics, features and inheritance patterns of FHD. We also discussed genetic testing, including the appropriate family members to test, the process of testing, insurance coverage and turn-around-time for results. We discussed the implications of a negative and a positive. We recommended Mr. Eves pursue genetic testing for the FH pathogenic gene mutation identified in his family.   PLAN: After considering the risks, benefits, and limitations, Mr. Vickerman provided informed consent to pursue genetic testing and the blood sample was sent to [facility] for analysis of the FH gene. Results should be available within approximately 2-3 weeks' time, at which point they will be disclosed by telephone to Mr. Bodie, as will any additional recommendations warranted by these results. Mr. Wise will receive a summary of his genetic counseling visit and a copy of his results once available. This information will also be available in Epic.   Lastly, we encouraged Mr. Vallo to remain in contact with cancer genetics annually so that we can continuously update the family history and inform him of any changes in cancer genetics and testing that may be of benefit for this family.   Mr. Errico questions were answered to his satisfaction today. Our contact information was provided should additional questions or concerns arise. Thank you for the referral and allowing [place] to share in the care of your patient.   Kada Friesen  P. Florene Glen, [place], [facility] Licensed, Insurance risk surveyor [email or web address] phone: [phone]  The patient was seen for a total of 40 minutes in face-to-face genetic counseling.  This patient was discussed with Drs. Magrinat, Lindi Adie and/or Burr Medico who agrees with the above.    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

## 2021-04-04 ENCOUNTER — Encounter: Payer: Self-pay | Admitting: Physician Assistant

## 2021-04-04 ENCOUNTER — Ambulatory Visit (INDEPENDENT_AMBULATORY_CARE_PROVIDER_SITE_OTHER): Payer: Commercial Managed Care - PPO | Admitting: Physician Assistant

## 2021-04-04 ENCOUNTER — Other Ambulatory Visit: Payer: Self-pay

## 2021-04-04 VITALS — BP 130/86 | HR 83 | Temp 98.3°F | Ht 72.0 in | Wt 220.0 lb

## 2021-04-04 DIAGNOSIS — Z0000 Encounter for general adult medical examination without abnormal findings: Secondary | ICD-10-CM

## 2021-04-04 DIAGNOSIS — E663 Overweight: Secondary | ICD-10-CM

## 2021-04-04 DIAGNOSIS — M109 Gout, unspecified: Secondary | ICD-10-CM

## 2021-04-04 DIAGNOSIS — Z136 Encounter for screening for cardiovascular disorders: Secondary | ICD-10-CM

## 2021-04-04 DIAGNOSIS — Z13220 Encounter for screening for lipoid disorders: Secondary | ICD-10-CM | POA: Diagnosis not present

## 2021-04-04 NOTE — Patient Instructions (Signed)
It was great to see you!  If you do not hear anything in the next 2 weeks about colonoscopy scheduling, call us.  Please go to the lab for blood work.   Our office will call you with your results unless you have chosen to receive results via MyChart.  If your blood work is normal we will follow-up each year for physicals and as scheduled for chronic medical problems.  If anything is abnormal we will treat accordingly and get you in for a follow-up.  Take care,  [facility] Maintenance, Male Adopting a healthy lifestyle and getting preventive care are important in promoting health and wellness. Ask your health care provider about:  The right schedule for you to have regular tests and exams.  Things you can do on your own to prevent diseases and keep yourself healthy. What should I know about diet, weight, and exercise? Eat a healthy diet  Eat a diet that includes plenty of vegetables, fruits, low-fat dairy products, and lean protein.  Do not eat a lot of foods that are high in solid fats, added sugars, or sodium.   Maintain a healthy weight Body mass index (BMI) is a measurement that can be used to identify possible weight problems. It estimates body fat based on height and weight. Your health care provider can help determine your BMI and help you achieve or maintain a healthy weight. Get regular exercise Get regular exercise. This is one of the most important things you can do for your health. Most adults should:  Exercise for at least 150 minutes each week. The exercise should increase your heart rate and make you sweat (moderate-intensity exercise).  Do strengthening exercises at least twice a week. This is in addition to the moderate-intensity exercise.  Spend less time sitting. Even light physical activity can be beneficial. Watch cholesterol and blood lipids Have your blood tested for lipids and cholesterol at 49 years of age, then have this test every 5  years. You may need to have your cholesterol levels checked more often if:  Your lipid or cholesterol levels are high.  You are older than 49 years of age.  You are at high risk for heart disease. What should I know about cancer screening? Many types of cancers can be detected early and may often be prevented. Depending on your health history and family history, you may need to have cancer screening at various ages. This may include screening for:  Colorectal cancer.  Prostate cancer.  Skin cancer.  Lung cancer. What should I know about heart disease, diabetes, and high blood pressure? Blood pressure and heart disease  High blood pressure causes heart disease and increases the risk of stroke. This is more likely to develop in people who have high blood pressure readings, are of African descent, or are overweight.  Talk with your health care provider about your target blood pressure readings.  Have your blood pressure checked: ? Every 3-5 years if you are 55-8 years of age. ? Every year if you are 41 years old or older.  If you are between the ages of 30 and 77 and are a current or former smoker, ask your health care provider if you should have a one-time screening for abdominal aortic aneurysm (AAA). Diabetes Have regular diabetes screenings. This checks your fasting blood sugar level. Have the screening done:  Once every three years after age 51 if you are at a normal weight and have a low risk for  diabetes.  More often and at a younger age if you are overweight or have a high risk for diabetes. What should I know about preventing infection? Hepatitis B If you have a higher risk for hepatitis B, you should be screened for this virus. Talk with your health care provider to find out if you are at risk for hepatitis B infection. Hepatitis C Blood testing is recommended for:  Everyone born from [birth year] through 1964.  Anyone with known risk factors for hepatitis C. Sexually  transmitted infections (STIs)  You should be screened each year for STIs, including gonorrhea and chlamydia, if: ? You are sexually active and are younger than 49 years of age. ? You are older than 49 years of age and your health care provider tells you that you are at risk for this type of infection. ? Your sexual activity has changed since you were last screened, and you are at increased risk for chlamydia or gonorrhea. Ask your health care provider if you are at risk.  Ask your health care provider about whether you are at high risk for HIV. Your health care provider may recommend a prescription medicine to help prevent HIV infection. If you choose to take medicine to prevent HIV, you should first get tested for HIV. You should then be tested every 3 months for as long as you are taking the medicine. Follow these instructions at home: Lifestyle  Do not use any products that contain nicotine or tobacco, such as cigarettes, e-cigarettes, and chewing tobacco. If you need help quitting, ask your health care provider.  Do not use street drugs.  Do not share needles.  Ask your health care provider for help if you need support or information about quitting drugs. Alcohol use  Do not drink alcohol if your health care provider tells you not to drink.  If you drink alcohol: ? Limit how much you have to 0-2 drinks a day. ? Be aware of how much alcohol is in your drink. In the [place] one drink equals one 12 oz bottle of beer (355 mL), one 5 oz glass of wine (148 mL), or one 1 oz glass of hard liquor (44 mL). General instructions  Schedule regular health, dental, and eye exams.  Stay current with your vaccines.  Tell your health care provider if: ? You often feel depressed. ? You have ever been abused or do not feel safe at home. Summary  Adopting a healthy lifestyle and getting preventive care are important in promoting health and wellness.  Follow your health care provider's  instructions about healthy diet, exercising, and getting tested or screened for diseases.  Follow your health care provider's instructions on monitoring your cholesterol and blood pressure. This information is not intended to replace advice given to you by your health care provider. Make sure you discuss any questions you have with your health care provider. Document Revised: 12/04/2018 Document Reviewed: 12/04/2018 Elsevier Patient Education  2021 [employer].

## 2021-04-04 NOTE — Progress Notes (Signed)
I acted as a Education administrator for [employer], PA-C Elijah Pickler, LPN   Subjective:    Elijah King is a 50 y.o. male and is here for a comprehensive physical exam.  HPI  Health Maintenance Due  Topic Date Due  . Hepatitis C Screening  Never done  . COVID-19 Vaccine (1) Never done  . COLONOSCOPY (Pts 45-27yrs Insurance coverage will need to be confirmed)  Never done    Acute Concerns: None  Chronic Issues: Gout -- continues to see rheumatology regularly and sports med prn for this. Take allopurinol 300 mg BID.  Health Maintenance: Immunizations -- UTD Colonoscopy -- due -- ordered PSA -- N/A Diet -- tries to eat well balanced as able Sleep habits -- no concerns Exercise -- plays sports with son regularly Weight -- Weight: 220 lb (99.8 kg)  Weight history Wt Readings from Last 10 Encounters:  04/04/21 220 lb (99.8 kg)  03/03/21 220 lb (99.8 kg)  01/26/21 222 lb 6.4 oz (100.9 kg)  01/13/21 215 lb (97.5 kg)  07/09/20 215 lb (97.5 kg)  06/21/20 214 lb 6.4 oz (97.3 kg)  12/29/19 228 lb (103.4 kg)  12/23/19 228 lb (103.4 kg)  12/08/19 228 lb 4 oz (103.5 kg)  11/04/18 221 lb 4 oz (100.4 kg)   Body mass index is 29.84 kg/m. Mood -- no concerns Tobacco use --  Tobacco Use: Medium Risk  . Smoking Tobacco Use: Former Smoker  . Smokeless Tobacco Use: Never Used    Alcohol use ---  reports current alcohol use.   Depression screen PHQ 2/9 04/04/2021  Decreased Interest 0  Down, Depressed, Hopeless 0  PHQ - 2 Score 0     Other providers/specialists: Patient Care Team: Elijah King, [place] as PCP - General (Physician Assistant)   PMHx, SurgHx, SocialHx, Medications, and Allergies were reviewed in the Visit Navigator and updated as appropriate.   Past Medical History:  Diagnosis Date  . Chronic kidney disease   . Family history of genetic disease carrier   . Gout      Past Surgical History:  Procedure Laterality Date  . KIDNEY CYST REMOVAL Left 1983      Family History  Problem Relation Age of Onset  . Anuerysm Father   . Lung cancer Father   . Breast cancer Maternal Aunt   . Ovarian cancer Maternal Aunt   . Breast cancer Other        MGMs mother dx in her 63s  . Colon cancer Neg Hx   . Prostate cancer Neg Hx     Social History   Tobacco Use  . Smoking status: Former Smoker    Packs/day: 0.50    Types: Cigarettes    Start date: 03/03/1989    Quit date: 12/25/2017    Years since quitting: 3.2  . Smokeless tobacco: Never Used  Vaping Use  . Vaping Use: Never used  Substance Use Topics  . Alcohol use: Yes    Alcohol/week: 0.0 standard drinks    Comment: twice weekly 5 drinks total  . Drug use: No    Review of Systems:   Review of Systems  Constitutional: Negative for chills, fever, malaise/fatigue and weight loss.  HENT: Negative for hearing loss, sinus pain and sore throat.   Eyes: Negative for blurred vision.  Respiratory: Negative for cough and shortness of breath.   Cardiovascular: Negative for chest pain, palpitations and leg swelling.  Gastrointestinal: Negative for abdominal pain, constipation, diarrhea, heartburn, nausea and vomiting.  Genitourinary:  Negative for dysuria, frequency and urgency.  Musculoskeletal: Negative for back pain, myalgias and neck pain.  Skin: Negative for itching and rash.  Neurological: Negative for dizziness, tingling, seizures, loss of consciousness and headaches.  Endo/Heme/Allergies: Negative for polydipsia.  Psychiatric/Behavioral: Negative for depression. The patient is not nervous/anxious.   All other systems reviewed and are negative.   Objective:   Vitals:   04/04/21 1550  BP: 130/86  Pulse: 83  Temp: 98.3 F (36.8 C)  SpO2: 96%   Body mass index is 29.84 kg/m.  General Appearance:  Alert, cooperative, no distress, appears stated age  Head:  Normocephalic, without obvious abnormality, atraumatic  Eyes:  PERRL, conjunctiva/corneas clear, EOM's intact, fundi  benign, both eyes       Ears:  Normal TM's and external ear canals, both ears  Nose: Nares normal, septum midline, mucosa normal, no drainage    or sinus tenderness  Throat: Lips, mucosa, and tongue normal; teeth and gums normal  Neck: Supple, symmetrical, trachea midline, no adenopathy; thyroid:  No enlargement/tenderness/nodules; no carotit bruit or JVD  Back:   Symmetric, no curvature, ROM normal, no CVA tenderness  Lungs:   Clear to auscultation bilaterally, respirations unlabored  Chest wall:  No tenderness or deformity  Heart:  Regular rate and rhythm, S1 and S2 normal, no murmur, rub   or gallop  Abdomen:   Soft, non-tender, bowel sounds active all four quadrants, no masses, no organomegaly  Extremities: Extremities normal, atraumatic, no cyanosis or edema  Prostate: Not done.   Skin: Skin color, texture, turgor normal, no rashes or lesions  Lymph nodes: Cervical, supraclavicular, and axillary nodes normal  Neurologic: CNII-XII grossly intact. Normal strength, sensation and reflexes throughout    Assessment/Plan:   Elijah King was seen today for annual exam.  Diagnoses and all orders for this visit:  Routine physical examination Today patient counseled on age appropriate routine health concerns for screening and prevention, each reviewed and up to date or declined. Immunizations reviewed and up to date or declined. Labs ordered and reviewed. Risk factors for depression reviewed and negative. Hearing function and visual acuity are intact. ADLs screened and addressed as needed. Functional ability and level of safety reviewed and appropriate. Education, counseling and referrals performed based on assessed risks today. Patient provided with a copy of personalized plan for preventive services.  Polyarticular gout Mgmt per rheum and sports medicine. Denies acute concerns. -     CBC with Differential/Platelet -     Comprehensive metabolic panel  Overweight Continue efforts towards healthy  lifestyle.  Encounter for lipid screening for cardiovascular disease -     Lipid panel    Well Adult Exam: Labs ordered: Yes. Patient counseling was done. See below for items discussed. Discussed the patient's BMI.  The BMI is not in the acceptable range; BMI management plan is completed Follow up in one year.  Patient Counseling: [x]   Nutrition: Stressed importance of moderation in sodium/caffeine intake, saturated fat and cholesterol, caloric balance, sufficient intake of fresh fruits, vegetables, and fiber.  [x]   Stressed the importance of regular exercise.   []   Substance Abuse: Discussed cessation/primary prevention of tobacco, alcohol, or other drug use; driving or other dangerous activities under the influence; availability of treatment for abuse.   [x]   Injury prevention: Discussed safety belts, safety helmets, smoke detector, smoking near bedding or upholstery.   []   Sexuality: Discussed sexually transmitted diseases, partner selection, use of condoms, avoidance of unintended pregnancy  and contraceptive alternatives.   [  x]  Dental health: Discussed importance of regular tooth brushing, flossing, and dental visits.  [x]   Health maintenance and immunizations reviewed. Please refer to Health maintenance section.    CMA or LPN served as scribe during this visit. History, Physical, and Plan performed by medical provider. The above documentation has been reviewed and is accurate and complete.  Elijah Coke, PA-C [place]

## 2021-04-05 LAB — CBC WITH DIFFERENTIAL/PLATELET
Basophils Absolute: 0.1 10*3/uL (ref 0.0–0.1)
Basophils Relative: 1 % (ref 0.0–3.0)
Eosinophils Absolute: 0.4 10*3/uL (ref 0.0–0.7)
Eosinophils Relative: 5.3 % — ABNORMAL HIGH (ref 0.0–5.0)
HCT: 45.8 % (ref 39.0–52.0)
Hemoglobin: 15.2 g/dL (ref 13.0–17.0)
Lymphocytes Relative: 23.7 % (ref 12.0–46.0)
Lymphs Abs: 1.8 10*3/uL (ref 0.7–4.0)
MCHC: 33.2 g/dL (ref 30.0–36.0)
MCV: 93.4 fl (ref 78.0–100.0)
Monocytes Absolute: 0.6 10*3/uL (ref 0.1–1.0)
Monocytes Relative: 7.7 % (ref 3.0–12.0)
Neutro Abs: 4.7 10*3/uL (ref 1.4–7.7)
Neutrophils Relative %: 62.3 % (ref 43.0–77.0)
Platelets: 201 10*3/uL (ref 150.0–400.0)
RBC: 4.91 Mil/uL (ref 4.22–5.81)
RDW: 14.3 % (ref 11.5–15.5)
WBC: 7.6 10*3/uL (ref 4.0–10.5)

## 2021-04-05 LAB — COMPREHENSIVE METABOLIC PANEL
ALT: 39 U/L (ref 0–53)
AST: 26 U/L (ref 0–37)
Albumin: 4.5 g/dL (ref 3.5–5.2)
Alkaline Phosphatase: 82 U/L (ref 39–117)
BUN: 13 mg/dL (ref 6–23)
CO2: 28 mEq/L (ref 19–32)
Calcium: 9.8 mg/dL (ref 8.4–10.5)
Chloride: 102 mEq/L (ref 96–112)
Creatinine, Ser: 1.21 mg/dL (ref 0.40–1.50)
GFR: 70.77 mL/min (ref >60.00)
Glucose, Bld: 85 mg/dL (ref 70–99)
Potassium: 4.6 mEq/L (ref 3.5–5.1)
Sodium: 139 mEq/L (ref 135–145)
Total Bilirubin: 0.4 mg/dL (ref 0.2–1.2)
Total Protein: 7.3 g/dL (ref 6.0–8.3)

## 2021-04-05 LAB — LIPID PANEL
Cholesterol: 199 mg/dL (ref 0–200)
HDL: 61.6 mg/dL (ref >39.00)
NonHDL: 137.29
Total CHOL/HDL Ratio: 3
Triglycerides: 220 mg/dL — ABNORMAL HIGH (ref 0.0–149.0)
VLDL: 44 mg/dL — ABNORMAL HIGH (ref 0.0–40.0)

## 2021-04-05 LAB — LDL CHOLESTEROL, DIRECT: Direct LDL: 125 mg/dL

## 2021-04-06 ENCOUNTER — Encounter: Payer: Self-pay | Admitting: Genetic Counselor

## 2021-04-06 ENCOUNTER — Telehealth: Payer: Self-pay | Admitting: Genetic Counselor

## 2021-04-06 DIAGNOSIS — Z1379 Encounter for other screening for genetic and chromosomal anomalies: Secondary | ICD-10-CM | POA: Insufficient documentation

## 2021-04-06 NOTE — Telephone Encounter (Signed)
LM on VM that results were back and to please call. ?

## 2021-04-07 ENCOUNTER — Telehealth: Payer: Self-pay | Admitting: Genetic Counselor

## 2021-04-07 ENCOUNTER — Ambulatory Visit: Payer: Self-pay | Admitting: Genetic Counselor

## 2021-04-07 DIAGNOSIS — Z1379 Encounter for other screening for genetic and chromosomal anomalies: Secondary | ICD-10-CM

## 2021-04-07 NOTE — Telephone Encounter (Signed)
Revealed that the patient tested negative for the hereditary mutation in [place] that has been identified in his family.

## 2021-04-07 NOTE — Progress Notes (Signed)
HPI:  [name] was previously seen in the [facility] due to a family history of a known familial mutation in the [place] gene. Please refer to our prior cancer genetics clinic note for more information regarding our discussion, assessment and recommendations, at the time. [name] recent genetic test results were disclosed to him, as were recommendations warranted by these results. These results and recommendations are discussed in more detail below.  CANCER HISTORY:  Oncology History   No history exists.    FAMILY HISTORY:  We obtained a detailed, 4-generation family history.  Significant diagnoses are listed below: Family History  Problem Relation Age of Onset  . Elijah King Father   . Lung cancer Father   . Breast cancer Maternal Aunt   . Ovarian cancer Maternal Aunt   . Breast cancer Other        MGMs mother dx in her 43s  . Colon cancer Neg Hx   . Prostate cancer Neg Hx     The patient has one son who is cancer free.  He has a brother who is cancer free.  Both parents are living.  The patient's father had lung cancer.  He has one brother who is cancer free.  His parents are deceased from non-cancer related issues.  The patient's mother is living and cancer free.  She had genetic testing in the past and was found to be a carrier of Elijah King.  She has a sister who had both breast and ovarian cancer.  Her parents are deceased from non-cancer related issues.  Her mother was adopted, but her biological mother died in her 26's from breast cancer.  [name] is aware of previous family history of genetic testing for hereditary cancer risks. Patient's maternal ancestors are of European descent, and paternal ancestors are of European descent. There is no reported Ashkenazi Jewish ancestry. There is no known consanguinity.  GENETIC TEST RESULTS: We recommended [name] pursue testing for the familial hereditary cancer gene mutation called FH,  [id number] ([id number]). [name] test was normal and did not reveal the familial mutation. We call this result a true negative result because the cancer-causing mutation was identified in [name] family, and he did not inherit it.  Given this negative result, [name] chances of developing FH-related cancers are the same as they are in the general population.  The test report has been scanned into EPIC and is located under the Molecular Pathology section of the Results Review tab.  A portion of the result report is included below for reference.     ADDITIONAL GENETIC TESTING: We discussed with [name] that there are other genes that are associated with increased cancer risk that can be analyzed. Should [name] wish to pursue additional genetic testing, we are happy to discuss and coordinate this testing, at any time.    CANCER SCREENING RECOMMENDATIONS: [name] test result is considered negative (normal).  This means that we have not identified a hereditary cause for his family history of a known hereditary gene mutation at this time. Most cancers happen by chance and this negative test suggests that his cancer may fall into this category.    While reassuring, this does not definitively rule out a hereditary predisposition to cancer. It is still possible that there could be genetic mutations that are undetectable by current technology. There could be genetic mutations in genes that have not been tested or identified to increase cancer risk.  Therefore,  it is recommended he continue to follow the cancer management and screening guidelines provided by his primary healthcare provider.   An individual's cancer risk and medical management are not determined by genetic test results alone. Overall cancer risk assessment incorporates additional factors, including personal medical history, family history, and any available genetic information that may result in a personalized plan for cancer  prevention and surveillance  RECOMMENDATIONS FOR FAMILY MEMBERS:  Individuals in this family might be at some increased risk of developing cancer, over the general population risk, simply due to the family history of cancer.  We recommended women in this family have a yearly mammogram beginning at age 3, or 65 years younger than the earliest onset of cancer, an annual clinical breast exam, and perform monthly breast self-exams. Women in this family should also have a gynecological exam as recommended by their primary provider. All family members should be referred for colonoscopy starting at age 9.  FOLLOW-UP: Lastly, we discussed with [name] that cancer genetics is a rapidly advancing field and it is possible that new genetic tests will be appropriate for him and/or his family members in the future. We encouraged him to remain in contact with cancer genetics on an annual basis so we can update his personal and family histories and let him know of advances in cancer genetics that may benefit this family.   Our contact number was provided. Elijah King were answered to his satisfaction, and he knows he is welcome to call us at anytime with additional King or concerns.   Elijah King, [place], [facility] Licensed, Certified Genetic Counselor [email or web address]

## 2021-04-19 ENCOUNTER — Ambulatory Visit (INDEPENDENT_AMBULATORY_CARE_PROVIDER_SITE_OTHER): Payer: Commercial Managed Care - PPO | Admitting: Internal Medicine

## 2021-04-19 ENCOUNTER — Encounter: Payer: Self-pay | Admitting: Internal Medicine

## 2021-04-19 ENCOUNTER — Other Ambulatory Visit: Payer: Self-pay

## 2021-04-19 VITALS — BP 143/92 | HR 101 | Ht 72.0 in | Wt 220.0 lb

## 2021-04-19 DIAGNOSIS — G8929 Other chronic pain: Secondary | ICD-10-CM

## 2021-04-19 DIAGNOSIS — M10061 Idiopathic gout, right knee: Secondary | ICD-10-CM

## 2021-04-19 DIAGNOSIS — M10072 Idiopathic gout, left ankle and foot: Secondary | ICD-10-CM

## 2021-04-19 DIAGNOSIS — M109 Gout, unspecified: Secondary | ICD-10-CM

## 2021-04-19 MED ORDER — TRIAMCINOLONE ACETONIDE 40 MG/ML IJ SUSP
40.0000 mg | INTRAMUSCULAR | Status: AC | PRN
  Administered 2021-04-19: 40 mg via INTRA_ARTICULAR

## 2021-04-19 MED ORDER — LIDOCAINE HCL 1 % IJ SOLN
3.0000 mL | INTRAMUSCULAR | Status: AC | PRN
  Administered 2021-04-19: 3 mL

## 2021-04-19 MED ORDER — PREDNISONE 10 MG PO TABS: ORAL_TABLET | ORAL | 0 refills | Status: AC

## 2021-04-19 NOTE — Progress Notes (Signed)
Office Visit Note  Patient: Elijah King             Date of Birth: Oct 10, 1971           MRN: [id number]             PCP: [name], PA Referring: [name], PA Visit Date: 04/19/2021   Subjective:  Follow-up (Patient complains of gout flare in left foot and right knee, extreme pain, swelling, and redness. )   History of Present Illness: DERRION TRITZ is a 50 y.o. male with a history of polyarticular gout since decades ago on allopurinol 600 mg per day here with acute flare of pain since 2 days ago. Symptoms started at left lateral ankle and foot with subsequent expansion to involve left foot toes and whole ankle. Now also feeling right knee pain but much less than the left side symptoms. His last uric acid level was 3.2 in February and he denies missing or changing his medication since then. He is not currently on any colchicine.  Activities of Daily Living:  Patient reports morning stiffness for 24 hours.   Patient Reports nocturnal pain.  Difficulty dressing/grooming: Reports Difficulty climbing stairs: Reports Difficulty getting out of chair: Reports Difficulty using hands for taps, buttons, cutlery, and/or writing: Denies  Review of Systems  Constitutional: Negative for fatigue.  HENT: Negative for mouth sores, mouth dryness and nose dryness.   Eyes: Negative for pain, itching, visual disturbance and dryness.  Respiratory: Negative for cough, hemoptysis, shortness of breath and difficulty breathing.   Cardiovascular: Negative for chest pain, palpitations and swelling in legs/feet.  Gastrointestinal: Negative for abdominal pain, blood in stool, constipation and diarrhea.  Endocrine: Negative for increased urination.  Genitourinary: Negative for painful urination.  Musculoskeletal: Positive for arthralgias, joint pain, joint swelling and morning stiffness. Negative for myalgias, muscle weakness, muscle tenderness and myalgias.  Skin: Positive for redness.  Negative for color change and rash.  Allergic/Immunologic: Negative for susceptible to infections.  Neurological: Negative for dizziness, numbness, headaches, memory loss and weakness.  Hematological: Negative for swollen glands.  Psychiatric/Behavioral: Positive for sleep disturbance. Negative for confusion.    PMFS History:  Patient Active Problem List   Diagnosis Date Noted  . Genetic testing 04/06/2021  . Family history of breast cancer 03/31/2021  . Family history of ovarian cancer 03/31/2021  . Family history of genetic disease carrier   . Elbow swelling, left 03/03/2021  . Pes anserine bursitis 07/12/2020  . Elevated blood pressure reading 03/28/2017  . Snoring 03/28/2017  . 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 Chronic kidney disease 03/28/2017  . Former smoker 03/28/2017  . Obesity (BMI 30-39.9) 03/28/2017  . Gastroesophageal reflux disease 03/28/2017  . 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    Past Medical History:  Diagnosis Date  . Chronic kidney disease   . Family history of genetic disease carrier   . Gout     Family History  Problem Relation Age of Onset  . Anuerysm Father   . Lung cancer Father   . Breast cancer Maternal Aunt   . Ovarian cancer Maternal Aunt   . Breast cancer Other        MGMs mother dx in her 46s  . Colon cancer Neg Hx   . Prostate cancer Neg Hx    Past Surgical History:  Procedure Laterality Date  . KIDNEY CYST REMOVAL Left 1983   Social History   Social History Narrative   Has a desk job   Live  in [place]   Married, 1 child (son)   Immunization History  Administered Date(s) Administered  . Influenza Split 01/02/2013  . Influenza,inj,Quad PF,6+ Mos 09/13/2018  . Influenza-Unspecified 09/24/2019  . Tdap 03/28/2017     Objective: Vital Signs: BP (!) 143/92 (BP Location: Left Arm, Patient Position: Sitting, Cuff Size: Normal)   Pulse (!) 101   Ht 6' (1.829 m)   Wt 220 lb (99.8 kg)   BMI 29.84 kg/m    Physical Exam Skin:     General: Skin is warm and dry.     Findings: Erythema present.  Neurological:     General: No focal deficit present.     Mental Status: He is alert.  Psychiatric:        Mood and Affect: Mood normal.    Musculoskeletal Exam:  Right knee warmth and tenderness at superomedial margin with small effusion Eythema, swelling, and tenderness to pressure throughout left foot worst at lateral ankle just anterior and distal to lateral malleolus  POCUS limited exam shows subcutaneous edema on lateral ankle without visible joint effusion collection  Investigation: No additional findings.  Imaging: No results found.  Recent Labs: Lab Results  Component Value Date   WBC 7.6 04/04/2021   HGB 15.2 04/04/2021   PLT 201.0 04/04/2021   NA 139 04/04/2021   K 4.6 04/04/2021   CL 102 04/04/2021   CO2 28 04/04/2021   GLUCOSE 85 04/04/2021   BUN 13 04/04/2021   CREATININE 1.21 04/04/2021   BILITOT 0.4 04/04/2021   ALKPHOS 82 04/04/2021   AST 26 04/04/2021   ALT 39 04/04/2021   PROT 7.3 04/04/2021   ALBUMIN 4.5 04/04/2021   CALCIUM 9.8 04/04/2021   GFRAA 81 05/13/2016    Speciality Comments: No specialty comments available.  Procedures:  Medium Joint Inj: L ankle on 04/19/2021 11:56 AM Indications: joint swelling and pain Details: 25 G 1.5 in needle, lateral approach Medications: 3 mL lidocaine 1 %; 40 mg triamcinolone acetonide 40 MG/ML Outcome: tolerated well, no immediate complications Immediately prior to procedure a time out was called to verify the correct patient, procedure, equipment, support staff and site/side marked as required. Patient was prepped and draped in the usual sterile fashion.     Allergies: Patient has no known allergies.   Assessment / Plan:     Visit Diagnoses: Polyarticular gout - Plan: predniSONE (DELTASONE) 10 MG tablet  Symptoms look consistent with acute gout flare with severe pain, abrupt onset very erythematous and swollen. This still remains unusual  given his normal or low uric acid with reported adherence to allopurinol 600 mg per day dose. Injection of left ankle by lateral approach today as this seems to be the site of maximum severity and will also prescribed a course of oral prednisone due to some other joints involvement. Recommend f/u in about 3-4 weeks hopefully while not in any flare to reassess. May be a candidate for DECT to figure out chronic gout picture since labs and synovial fluid inconsistent.   Orders: Orders Placed This Encounter  Procedures  . Medium Joint Inj   Meds ordered this encounter  Medications  . predniSONE (DELTASONE) 10 MG tablet    Sig: Take 4 tablets (40 mg total) by mouth daily with breakfast for 2 days, THEN 3 tablets (30 mg total) daily with breakfast for 2 days, THEN 2 tablets (20 mg total) daily with breakfast for 2 days, THEN 1 tablet (10 mg total) daily with breakfast for 2 days.  Dispense:  20 tablet    Refill:  0     Follow-Up Instructions: Return in about 4 weeks (around 05/17/2021) for Gout f/u, recent flare and treatment low uric acid.   Collier Salina, MD  Note - This record has been created using [employer].  Chart creation errors have been sought, but may not always  have been located. Such creation errors do not reflect on  the standard of medical care.

## 2021-04-25 ENCOUNTER — Ambulatory Visit: Payer: Commercial Managed Care - PPO | Admitting: Internal Medicine

## 2021-04-26 NOTE — Progress Notes (Signed)
Office Visit Note  Patient: Elijah King             Date of Birth: 1971-05-29           MRN: [id number]             PCP: [name], PA Referring: [name], PA Visit Date: 04/27/2021   Subjective:  Follow-up (Patient complains of gout flare in right knee- extreme pain, swelling, and redness. Patient finished prednisone taper. )   History of Present Illness: Elijah King is a 50 y.o. male here for follow up for inflammatory joint pain in multiple sites now involving left knee. He has history of polyarticular gout but uric acid levels have been well controlled and continuing to flare up. He was just seen for ankle and foot pain and swelling with steroid injection treatment about a week ago and started on a prednisone taper. He finished the prednisone taper but he continues to have severe pain with redness, heat, and swelling in the right knee.    Review of Systems  Constitutional: Negative for fatigue.  HENT: Negative for mouth sores, mouth dryness and nose dryness.   Eyes: Negative for pain, itching, visual disturbance and dryness.  Respiratory: Negative for cough, hemoptysis, shortness of breath and difficulty breathing.   Cardiovascular: Negative for chest pain, palpitations and swelling in legs/feet.  Gastrointestinal: Negative for abdominal pain, blood in stool, constipation and diarrhea.  Endocrine: Negative for increased urination.  Genitourinary: Negative for painful urination.  Musculoskeletal: Positive for arthralgias, joint pain, joint swelling and morning stiffness. Negative for myalgias, muscle weakness, muscle tenderness and myalgias.  Skin: Positive for redness. Negative for color change and rash.  Allergic/Immunologic: Negative for susceptible to infections.  Neurological: Negative for dizziness, numbness, headaches, memory loss and weakness.  Hematological: Negative for swollen glands.  Psychiatric/Behavioral: Positive for sleep disturbance. Negative  for confusion.    PMFS History:  Patient Active Problem List   Diagnosis Date Noted  . High risk medication use 04/27/2021  . Inflammatory arthritis 04/27/2021  . Genetic testing 04/06/2021  . Family history of breast cancer 03/31/2021  . Family history of ovarian cancer 03/31/2021  . Family history of genetic disease carrier   . Elbow swelling, left 03/03/2021  . Pes anserine bursitis 07/12/2020  . Elevated blood pressure reading 03/28/2017  . Snoring 03/28/2017  . 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 Chronic kidney disease 03/28/2017  . Former smoker 03/28/2017  . Obesity (BMI 30-39.9) 03/28/2017  . Gastroesophageal reflux disease 03/28/2017  . 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    Past Medical History:  Diagnosis Date  . Chronic kidney disease   . Family history of genetic disease carrier   . Gout     Family History  Problem Relation Age of Onset  . Anuerysm Father   . Lung cancer Father   . Breast cancer Maternal Aunt   . Ovarian cancer Maternal Aunt   . Breast cancer Other        MGMs mother dx in her 98s  . Colon cancer Neg Hx   . Prostate cancer Neg Hx    Past Surgical History:  Procedure Laterality Date  . KIDNEY CYST REMOVAL Left 1983   Social History   Social History Narrative   Has a desk job   Live in [place]   Married, 1 child (son)   Immunization History  Administered Date(s) Administered  . Influenza Split 01/02/2013  . Influenza,inj,Quad PF,6+ Mos 09/13/2018  . Influenza-Unspecified 09/24/2019  .  Tdap 03/28/2017     Objective: Vital Signs: BP (!) 154/86 (BP Location: Right Arm, Patient Position: Supine, Cuff Size: Normal)   Pulse 86   Ht 6' (1.829 m)   Wt 220 lb (99.8 kg)   BMI 29.84 kg/m    Physical Exam Eyes:     Conjunctiva/sclera: Conjunctivae normal.  Skin:    General: Skin is warm and dry.     Comments: Erythema and heat to palpation over right patella anteriorly along superior and medial border  Neurological:     General: No  focal deficit present.     Mental Status: He is alert.  Psychiatric:        Mood and Affect: Mood normal.    Musculoskeletal Exam:  Knees left normal right severely tender to pressure anteriorly and medially, unable to flex or bear weight due to pain Ankles full ROM no tenderness or swelling MTPs full ROM no tenderness or swelling   Investigation: No additional findings.  Imaging: No results found.  Recent Labs: Lab Results  Component Value Date   WBC 7.6 04/04/2021   HGB 15.2 04/04/2021   PLT 201.0 04/04/2021   NA 139 04/04/2021   K 4.6 04/04/2021   CL 102 04/04/2021   CO2 28 04/04/2021   GLUCOSE 85 04/04/2021   BUN 13 04/04/2021   CREATININE 1.21 04/04/2021   BILITOT 0.4 04/04/2021   ALKPHOS 82 04/04/2021   AST 26 04/04/2021   ALT 39 04/04/2021   PROT 7.3 04/04/2021   ALBUMIN 4.5 04/04/2021   CALCIUM 9.8 04/04/2021   GFRAA 81 05/13/2016    Speciality Comments: No specialty comments available.  Procedures:  Large Joint Inj on 04/27/2021 11:55 AM Indications: pain and joint swelling Details: 25 G 1.5 in needle, medial approach Medications: 3 mL lidocaine 1 %; 40 mg triamcinolone acetonide 40 MG/ML Outcome: tolerated well, no immediate complications Procedure, treatment alternatives, risks and benefits explained, specific risks discussed. Consent was given by the patient. Immediately prior to procedure a time out was called to verify the correct patient, procedure, equipment, support staff and site/side marked as required. Patient was prepped and draped in the usual sterile fashion.     Allergies: Patient has no known allergies.   Assessment / Plan:     Visit Diagnoses: Inflammatory arthritis - Plan: Rheumatoid factor, Cyclic citrul peptide antibody, IgG, Sedimentation rate, Uric acid, HLA-B27 antigen  Inflammatory joint pain he has been seen multiple times now within the past 2 to 3 months with swelling in the olecranon bursa, left ankle, and now right knee long  history of chronic polyarticular gout but no uric acid crystals seen on bursa aspiration and he has had ongoing attacks despite uric acid that was 2.9 earlier this year.  Based on this I am suspicious for alternate causes of inflammatory arthritis.  Checking RF, CCP, HLA-B27 also repeating sed rate and uric acid level today.  Polyarticular gout - Plan: Uric acid  History of polyarticular gout but uric acid levels have been well controlled on allopurinol we will recheck this today.  Solitary kidney, acquired  Has history of nephrectomy for renal cysts with a slight reduction in GFR have to consider closely risks on any DMARD treatment with potential nephrotoxicity.  High risk medication use - Plan: Hepatitis panel, acute, QuantiFERON-TB Gold Plus, IgG, IgA, IgM  Working up for inflammatory arthritis considering possible treatments such as methotrexate or future biologic DMARD to prevent recurrent debilitating arthritis flares.  Checking hepatitis panel and QuantiFERON.  He  had recent CBC and CMP checked that were within normal limits in primary care clinic.   Orders: Orders Placed This Encounter  Procedures  . Rheumatoid factor  . Cyclic citrul peptide antibody, IgG  . Sedimentation rate  . Uric acid  . Hepatitis panel, acute  . QuantiFERON-TB Gold Plus  . IgG, IgA, IgM  . HLA-B27 antigen   No orders of the defined types were placed in this encounter.    Follow-Up Instructions: No follow-ups on file.   Collier Salina, MD  Note - This record has been created using [employer].  Chart creation errors have been sought, but may not always  have been located. Such creation errors do not reflect on  the standard of medical care.

## 2021-04-27 ENCOUNTER — Other Ambulatory Visit: Payer: Self-pay

## 2021-04-27 ENCOUNTER — Ambulatory Visit: Payer: Commercial Managed Care - PPO | Admitting: Internal Medicine

## 2021-04-27 ENCOUNTER — Encounter: Payer: Self-pay | Admitting: Internal Medicine

## 2021-04-27 VITALS — BP 154/86 | HR 86 | Ht 72.0 in | Wt 220.0 lb

## 2021-04-27 DIAGNOSIS — M109 Gout, unspecified: Secondary | ICD-10-CM

## 2021-04-27 DIAGNOSIS — Z1589 Genetic susceptibility to other disease: Secondary | ICD-10-CM | POA: Insufficient documentation

## 2021-04-27 DIAGNOSIS — M1380 Other specified arthritis, unspecified site: Secondary | ICD-10-CM | POA: Insufficient documentation

## 2021-04-27 DIAGNOSIS — M25561 Pain in right knee: Secondary | ICD-10-CM | POA: Diagnosis not present

## 2021-04-27 DIAGNOSIS — Z79899 Other long term (current) drug therapy: Secondary | ICD-10-CM

## 2021-04-27 DIAGNOSIS — M1990 Unspecified osteoarthritis, unspecified site: Secondary | ICD-10-CM

## 2021-04-27 DIAGNOSIS — Z905 Acquired absence of kidney: Secondary | ICD-10-CM

## 2021-04-27 MED ORDER — TRIAMCINOLONE ACETONIDE 40 MG/ML IJ SUSP
40.0000 mg | INTRAMUSCULAR | Status: AC | PRN
  Administered 2021-04-27: 40 mg via INTRA_ARTICULAR

## 2021-04-27 MED ORDER — LIDOCAINE HCL 1 % IJ SOLN
3.0000 mL | INTRAMUSCULAR | Status: AC | PRN
  Administered 2021-04-27: 3 mL

## 2021-04-27 NOTE — Patient Instructions (Addendum)
I am checking several blood tests for markers of rheumatoid arthritis and other kinds of inflammatory arthritis because your symptoms are not very typical for gout and especially with the uric acid at the goal level already. Continue your current medications in the meantime.  For the immediate symptoms relief I recommend trying topical lidocaine treatment this should be beneficial because the involved area is very close to the surface. You can ask the store pharmacist with help on this if you cannot find this easily.

## 2021-04-29 LAB — IGG, IGA, IGM
IgG (Immunoglobin G), Serum: 823 mg/dL (ref 600–1640)
IgM, Serum: 123 mg/dL (ref 50–300)
Immunoglobulin A: 143 mg/dL (ref 47–310)

## 2021-04-29 LAB — HEPATITIS PANEL, ACUTE
Hep A IgM: NONREACTIVE
Hep B C IgM: NONREACTIVE
Hepatitis B Surface Ag: NONREACTIVE
Hepatitis C Ab: NONREACTIVE
SIGNAL TO CUT-OFF: 0.04 (ref <1.00)

## 2021-04-29 LAB — URIC ACID: Uric Acid, Serum: 2.8 mg/dL — ABNORMAL LOW (ref 4.0–8.0)

## 2021-04-29 LAB — HLA-B27 ANTIGEN: HLA-B27 Antigen: NEGATIVE

## 2021-04-29 LAB — QUANTIFERON-TB GOLD PLUS
Mitogen-NIL: 0.96 IU/mL
NIL: 0.03 IU/mL
QuantiFERON-TB Gold Plus: NEGATIVE
TB1-NIL: <0 IU/mL
TB2-NIL: 0 IU/mL

## 2021-04-29 LAB — SEDIMENTATION RATE: Sed Rate: 14 mm/h (ref 0–15)

## 2021-04-29 LAB — CYCLIC CITRUL PEPTIDE ANTIBODY, IGG: Cyclic Citrullin Peptide Ab: <16 UNITS

## 2021-04-29 LAB — RHEUMATOID FACTOR: Rheumatoid fact SerPl-aCnc: <14 IU/mL (ref <14)

## 2021-05-01 ENCOUNTER — Encounter (HOSPITAL_BASED_OUTPATIENT_CLINIC_OR_DEPARTMENT_OTHER): Payer: Self-pay

## 2021-05-01 ENCOUNTER — Other Ambulatory Visit: Payer: Self-pay

## 2021-05-01 ENCOUNTER — Emergency Department (HOSPITAL_BASED_OUTPATIENT_CLINIC_OR_DEPARTMENT_OTHER)
Admission: EM | Admit: 2021-05-01 | Discharge: 2021-05-01 | Disposition: A | Discharge status: Home / Self Care | Payer: Commercial Managed Care - PPO | Attending: Emergency Medicine | Admitting: Emergency Medicine

## 2021-05-01 ENCOUNTER — Encounter: Payer: Self-pay | Admitting: Internal Medicine

## 2021-05-01 DIAGNOSIS — M25461 Effusion, right knee: Secondary | ICD-10-CM | POA: Insufficient documentation

## 2021-05-01 DIAGNOSIS — Z87891 Personal history of nicotine dependence: Secondary | ICD-10-CM | POA: Insufficient documentation

## 2021-05-01 DIAGNOSIS — N189 Chronic kidney disease, unspecified: Secondary | ICD-10-CM | POA: Insufficient documentation

## 2021-05-01 DIAGNOSIS — M25561 Pain in right knee: Secondary | ICD-10-CM | POA: Insufficient documentation

## 2021-05-01 MED ORDER — LIDOCAINE-EPINEPHRINE (PF) 2 %-1:200000 IJ SOLN
10.0000 mL | Freq: Once | INTRAMUSCULAR | Status: AC
  Administered 2021-05-01: 10 mL via INTRADERMAL
  Filled 2021-05-01: qty 20

## 2021-05-01 MED ORDER — HYDROCODONE-ACETAMINOPHEN 5-325 MG PO TABS
1.0000 | ORAL_TABLET | Freq: Once | ORAL | Status: AC
  Administered 2021-05-01: 1 via ORAL
  Filled 2021-05-01: qty 1

## 2021-05-01 MED ORDER — KETOROLAC TROMETHAMINE 60 MG/2ML IM SOLN
30.0000 mg | Freq: Once | INTRAMUSCULAR | Status: AC
  Administered 2021-05-01: 30 mg via INTRAMUSCULAR
  Filled 2021-05-01: qty 2

## 2021-05-01 MED ORDER — HYDROCODONE-ACETAMINOPHEN 5-325 MG PO TABS: 1.0000 | ORAL_TABLET | Freq: Four times a day (QID) | ORAL | 0 refills | Status: DC | PRN

## 2021-05-01 NOTE — ED Provider Notes (Signed)
[facility] Provider Note  CSN: [id number] Arrival date & time: 05/01/21 1524    History Chief Complaint  Patient presents with  . Knee Pain    HPI  Elijah King is a 50 y.o. male with history of congenital solitary kidney with baseline Cr 1.2 and a polyarticular inflammatory arthritis who has been followed by Ortho and Rheum for same. Treated for gout in the past but even with good uric acid control he continues to have pain and swelling, most recently in the R knee. He has pain with ROM. Had knee aspirated about 2 months ago with inflammatory cell count profile but no crystals. Saw Rheum 4 days ago for pain and had steroid injection as well as a panel of rheumatologic test which were all negative. He continues to have pain and swelling despite the steroid injection, denies any fevers. No other systemic symptoms.    Past Medical History:  Diagnosis Date  . Chronic kidney disease    only 1 kidney  . Family history of genetic disease carrier   . Gout     Past Surgical History:  Procedure Laterality Date  . KIDNEY CYST REMOVAL Left 1983  . NEPHRECTOMY      Family History  Problem Relation Age of Onset  . Anuerysm Father   . Lung cancer Father   . Breast cancer Maternal Aunt   . Ovarian cancer Maternal Aunt   . Breast cancer Other        MGMs mother dx in her 59s  . Colon cancer Neg Hx   . Prostate cancer Neg Hx     Social History   Tobacco Use  . Smoking status: Former Smoker    Packs/day: 0.50    Types: Cigarettes    Start date: 03/03/1989    Quit date: 12/25/2017    Years since quitting: 3.3  . Smokeless tobacco: Never Used  Vaping Use  . Vaping Use: Every day  Substance Use Topics  . Alcohol use: Yes    Alcohol/week: 0.0 standard drinks    Comment: twice weekly 5 drinks total  . Drug use: No     Home Medications Prior to Admission medications   Medication Sig Start Date End Date Taking? Authorizing Provider  allopurinol  (ZYLOPRIM) 300 MG tablet Take 1 tablet (300 mg total) by mouth 2 (two) times daily. 09/07/20  Yes Gregor Hams, MD  colchicine 0.6 MG tablet Take 1 tablet (0.6 mg total) by mouth daily. Patient taking differently: Take 0.6 mg by mouth as needed. 01/13/21  Yes Gregor Hams, MD  HYDROcodone-acetaminophen (NORCO/VICODIN) 5-325 MG tablet Take 1 tablet by mouth every 6 (six) hours as needed for severe pain. 05/01/21  Yes Truddie Hidden, MD  omeprazole (PRILOSEC) 20 MG capsule Take 20 mg by mouth as needed.   Yes [provider]     Allergies    Patient has no known allergies.   Review of Systems   Review of Systems A comprehensive review of systems was completed and negative except as noted in HPI.    Physical Exam BP (!) 169/97 (BP Location: Right Arm)   Pulse 88   Temp 98.5 F (36.9 C) (Oral)   Resp 20   Ht 6' (1.829 m)   Wt 99.8 kg   SpO2 99%   BMI 29.84 kg/m   Physical Exam Vitals and nursing note reviewed.  Constitutional:      Appearance: Normal appearance.  HENT:  Head: Normocephalic and atraumatic.     Nose: Nose normal.     Mouth/Throat:     Mouth: Mucous membranes are moist.  Eyes:     Extraocular Movements: Extraocular movements intact.     Conjunctiva/sclera: Conjunctivae normal.  Cardiovascular:     Rate and Rhythm: Normal rate.  Pulmonary:     Effort: Pulmonary effort is normal.     Breath sounds: Normal breath sounds.  Abdominal:     General: Abdomen is flat.     Palpations: Abdomen is soft.     Tenderness: There is no abdominal tenderness.  Musculoskeletal:        General: Swelling and tenderness present.     Cervical back: Neck supple.     Comments: R knee effusion, swelling, tenderness diffusely, minimal warmth, no erythema to suggest septic joint  Skin:    General: Skin is warm and dry.  Neurological:     General: No focal deficit present.     Mental Status: He is alert.  Psychiatric:        Mood and Affect: Mood normal.       ED Results / Procedures / Treatments   Labs (all labs ordered are listed, but only abnormal results are displayed) Labs Reviewed - No data to display  EKG None   Radiology No results found.  Procedures .Joint Aspiration/Arthrocentesis  Date/Time: 05/01/2021 4:05 PM Performed by: Truddie Hidden, MD Authorized by: Truddie Hidden, MD   Consent:    Consent obtained:  Verbal and written   Consent given by:  Patient   Risks discussed:  Bleeding, infection and pain Universal protocol:    Patient identity confirmed:  Verbally with patient Location:    Location:  Knee   Knee:  R knee Anesthesia:    Anesthesia method:  Local infiltration   Local anesthetic:  Lidocaine 2% WITH epi Procedure details:    Preparation: Patient was prepped and draped in usual sterile fashion     Needle gauge:  18 G   Ultrasound guidance: no     Approach:  Medial   Aspirate amount:  0   Steroid injected: no     Specimen collected: no   Post-procedure details:    Dressing:  Adhesive bandage   Procedure completion:  Tolerated well, no immediate complications    Medications Ordered in the ED Medications  HYDROcodone-acetaminophen (NORCO/VICODIN) 5-325 MG per tablet 1 tablet (has no administration in time range)  ketorolac (TORADOL) injection 30 mg (has no administration in time range)  lidocaine-EPINEPHrine (XYLOCAINE W/EPI) 2 %-1:200000 (PF) injection 10 mL (10 mLs Intradermal Given by Other 05/01/21 1552)     MDM Rules/Calculators/A&P MDM Patient reports he has had unsuccessful therapeutic arthrocentesis in the past but is willing to try again today. Offered knee immobilizer and short course of narcotics for pain control. Would prefer to avoid long term NSAIDs given his kidney disease.  ED Course  I have reviewed the triage vital signs and the nursing notes.  Pertinent labs & imaging results that were available during my care of the patient were reviewed by me and considered in  my medical decision making (see chart for details).  Clinical Course as of 05/01/21 1615  Sun May 01, 2021  1606 Arthrocentesis was unsuccessful. Plan IM toradol as a single dose here for pain control. Oral Norco, Knee immobilizer for comfort and Rheum follow up.  [CS]    Clinical Course User Index [CS] Truddie Hidden, MD    Final  Clinical Impression(s) / ED Diagnoses Final diagnoses:  Acute pain of right knee    Rx / DC Orders ED Discharge Orders         Ordered    HYDROcodone-acetaminophen (NORCO/VICODIN) 5-325 MG tablet  Every 6 hours PRN        05/01/21 1614           Truddie Hidden, MD 05/01/21 1615

## 2021-05-01 NOTE — ED Triage Notes (Signed)
R knee pain and swelling x 3 weeks. Pt has hx of gout and has been taking allopurinol without relief. Pt being followed by orthopedic and rheumatologist for same. Pain has been worse for past two days.

## 2021-05-02 ENCOUNTER — Telehealth: Payer: Self-pay

## 2021-05-02 ENCOUNTER — Other Ambulatory Visit: Payer: Self-pay | Admitting: Internal Medicine

## 2021-05-02 DIAGNOSIS — M1380 Other specified arthritis, unspecified site: Secondary | ICD-10-CM

## 2021-05-02 MED ORDER — METHOTREXATE 2.5 MG PO TABS: 15.0000 mg | ORAL_TABLET | ORAL | 0 refills | Status: AC

## 2021-05-02 NOTE — Telephone Encounter (Signed)
Patient's wife Anderson Malta called stating Brent's right knee was so painful he had to go to the ER yesterday, 05/01/21.  Anderson Malta states he was prescribed pain medication and told to follow-up with Dr. Benjamine Mola.  [employer] sent a picture of his knee through MyChart to Dr. Benjamine Mola.

## 2021-05-02 NOTE — Telephone Encounter (Signed)
FYI- I spoke with Elijah King he has increased right knee swelling and pain compared to Friday he saw the ED on Sunday and received an injection with toradol and short term pain medication. We discussed his labs look okay and recommend starting methotrexate 15 mg PO weekly for a seronegative arthritis. Discussed risks including cytopenias, hepatotoxicity, GI inflammation, and need for monitoring he agrees with plan. Has f/u scheduled 5/24 already.

## 2021-05-17 ENCOUNTER — Ambulatory Visit: Payer: Commercial Managed Care - PPO | Admitting: Internal Medicine

## 2021-06-10 ENCOUNTER — Ambulatory Visit (AMBULATORY_SURGERY_CENTER): Payer: Commercial Managed Care - PPO

## 2021-06-10 VITALS — Ht 72.0 in | Wt 220.0 lb

## 2021-06-10 DIAGNOSIS — Z1211 Encounter for screening for malignant neoplasm of colon: Secondary | ICD-10-CM

## 2021-06-10 MED ORDER — PEG-KCL-NACL-NASULF-NA ASC-C 100 G PO SOLR: 1.0000 | Freq: Once | ORAL | 0 refills | Status: AC

## 2021-06-10 NOTE — Progress Notes (Signed)

## 2021-06-22 ENCOUNTER — Encounter: Payer: Self-pay | Admitting: Internal Medicine

## 2021-06-24 ENCOUNTER — Ambulatory Visit (AMBULATORY_SURGERY_CENTER): Payer: Commercial Managed Care - PPO | Admitting: Internal Medicine

## 2021-06-24 ENCOUNTER — Other Ambulatory Visit: Payer: Self-pay

## 2021-06-24 ENCOUNTER — Encounter: Payer: Self-pay | Admitting: Internal Medicine

## 2021-06-24 VITALS — BP 142/93 | HR 72 | Temp 98.0°F | Resp 11 | Ht 72.0 in | Wt 220.0 lb

## 2021-06-24 DIAGNOSIS — D125 Benign neoplasm of sigmoid colon: Secondary | ICD-10-CM | POA: Diagnosis not present

## 2021-06-24 DIAGNOSIS — Z1211 Encounter for screening for malignant neoplasm of colon: Secondary | ICD-10-CM

## 2021-06-24 DIAGNOSIS — D120 Benign neoplasm of cecum: Secondary | ICD-10-CM

## 2021-06-24 DIAGNOSIS — K635 Polyp of colon: Secondary | ICD-10-CM

## 2021-06-24 MED ORDER — SODIUM CHLORIDE 0.9 % IV SOLN: 500.0000 mL | Freq: Once | INTRAVENOUS | Status: DC

## 2021-06-24 NOTE — Progress Notes (Signed)
No problems noted in the recovery room. maw 

## 2021-06-24 NOTE — Op Note (Signed)
[place] Patient Name: Michell Kader Procedure Date: 06/24/2021 9:36 AM MRN: [id number] Endoscopist: Jerene Bears , MD Age: 50 Referring MD:  Date of Birth: [birth year] Gender: Male Account #: [id number] Procedure:                Colonoscopy Indications:              Screening for colorectal malignant neoplasm, This                            is the patient's first colonoscopy Medicines:                Monitored Anesthesia Care Procedure:                Pre-Anesthesia Assessment:                           - Prior to the procedure, a History and Physical                            was performed, and patient medications and                            allergies were reviewed. The patient's tolerance of                            previous anesthesia was also reviewed. The risks                            and benefits of the procedure and the sedation                            options and risks were discussed with the patient.                            All questions were answered, and informed consent                            was obtained. Prior Anticoagulants: The patient has                            taken no previous anticoagulant or antiplatelet                            agents. ASA Grade Assessment: II - A patient with                            mild systemic disease. After reviewing the risks                            and benefits, the patient was deemed in                            satisfactory condition to undergo the procedure.  After obtaining informed consent, the colonoscope                            was passed under direct vision. Throughout the                            procedure, the patient's blood pressure, pulse, and                            oxygen saturations were monitored continuously. The                            CF [id number] #[id number] was introduced through the anus                            and advanced to the cecum,  identified by                            appendiceal orifice and ileocecal valve. The                            colonoscopy was performed without difficulty. The                            patient tolerated the procedure well. The quality                            of the bowel preparation was good. The ileocecal                            valve, appendiceal orifice, and rectum were                            photographed. Scope In: 9:45:01 AM Scope Out: 10:01:37 AM Scope Withdrawal Time: 0 hours 14 minutes 18 seconds  Total Procedure Duration: 0 hours 16 minutes 36 seconds  Findings:                 The digital rectal exam was normal.                           A 3 mm polyp was found in the cecum. The polyp was                            sessile. The polyp was removed with a cold snare.                            Resection and retrieval were complete.                           Four sessile polyps were found in the sigmoid                            colon. The polyps were 3 to 7 mm in  size. These                            polyps were removed with a cold snare. Resection                            and retrieval were complete.                           Internal hemorrhoids were found during                            retroflexion. The hemorrhoids were small. Complications:            No immediate complications. Estimated Blood Loss:     Estimated blood loss was minimal. Impression:               - One 3 mm polyp in the cecum, removed with a cold                            snare. Resected and retrieved.                           - Four 3 to 7 mm polyps in the sigmoid colon,                            removed with a cold snare. Resected and retrieved.                           - Small internal hemorrhoids. Recommendation:           - Patient has a contact number available for                            emergencies. The signs and symptoms of potential                            delayed  complications were discussed with the                            patient. Return to normal activities tomorrow.                            Written discharge instructions were provided to the                            patient.                           - Resume previous diet.                           - Continue present medications.                           - Await pathology results.                           -  Repeat colonoscopy is recommended. The                            colonoscopy date will be determined after pathology                            results from today's exam become available for                            review. Jerene Bears, MD 06/24/2021 10:04:06 AM This report has been signed electronically.

## 2021-06-24 NOTE — Progress Notes (Signed)
To PACU VSS. Report to [email or web address]

## 2021-06-24 NOTE — Patient Instructions (Addendum)
Handouts were given to your care partner on polyps and diverticulosis. You may resume your current medications today. Await biopsy results.  May take 1-3 weeks to receive pathology results. Please call if any questions or concerns.      YOU HAD AN ENDOSCOPIC PROCEDURE TODAY AT [facility]:   Refer to the procedure report that was given to you for any specific questions about what was found during the examination.  If the procedure report does not answer your questions, please call your gastroenterologist to clarify.  If you requested that your care partner not be given the details of your procedure findings, then the procedure report has been included in a sealed envelope for you to review at your convenience later.  YOU SHOULD EXPECT: Some feelings of bloating in the abdomen. Passage of more gas than usual.  Walking can help get rid of the air that was put into your GI tract during the procedure and reduce the bloating. If you had a lower endoscopy (such as a colonoscopy or flexible sigmoidoscopy) you may notice spotting of blood in your stool or on the toilet paper. If you underwent a bowel prep for your procedure, you may not have a normal bowel movement for a few days.  Please Note:  You might notice some irritation and congestion in your nose or some drainage.  This is from the oxygen used during your procedure.  There is no need for concern and it should clear up in a day or so.  SYMPTOMS TO REPORT IMMEDIATELY:  Following lower endoscopy (colonoscopy or flexible sigmoidoscopy):  Excessive amounts of blood in the stool  Significant tenderness or worsening of abdominal pains  Swelling of the abdomen that is new, acute  Fever of 100F or higher    For urgent or emergent issues, a gastroenterologist can be reached at any hour by calling [phone]. Do not use MyChart messaging for urgent concerns.    DIET:  We do recommend a small meal at first, but then you may  proceed to your regular diet.  Drink plenty of fluids but you should avoid alcoholic beverages for 24 hours.  ACTIVITY:  You should plan to take it easy for the rest of today and you should NOT DRIVE or use heavy machinery until tomorrow (because of the sedation medicines used during the test).    FOLLOW UP: Our staff will call the number listed on your records 48-72 hours following your procedure to check on you and address any questions or concerns that you may have regarding the information given to you following your procedure. If we do not reach you, we will leave a message.  We will attempt to reach you two times.  During this call, we will ask if you have developed any symptoms of COVID 19. If you develop any symptoms (ie: fever, flu-like symptoms, shortness of breath, cough etc.) before then, please call [phone].  If you test positive for Covid 19 in the 2 weeks post procedure, please call and report this information to [place].    If any biopsies were taken you will be contacted by phone or by letter within the next 1-3 weeks.  Please call us at [phone] if you have not heard about the biopsies in 3 weeks.    SIGNATURES/CONFIDENTIALITY: You and/or your care partner have signed paperwork which will be entered into your electronic medical record.  These signatures attest to the fact that that the information above on your After  Visit Summary has been reviewed and is understood.  Full responsibility of the confidentiality of this discharge information lies with you and/or your care-partner.

## 2021-06-24 NOTE — Progress Notes (Signed)
Medical history reviewed with no changes noted. VS assessed by C.W 

## 2021-06-24 NOTE — Progress Notes (Signed)
Called to room to assist during endoscopic procedure.  Patient ID and intended procedure confirmed with present staff. Received instructions for my participation in the procedure from the performing physician.  

## 2021-06-29 ENCOUNTER — Telehealth: Payer: Self-pay

## 2021-06-29 NOTE — Telephone Encounter (Signed)
  Follow up Call-  Call back number 06/24/2021  Post procedure Call Back phone  # [phone]  Permission to leave phone message Yes  Some recent data might be hidden     Patient questions:  Do you have a fever, pain , or abdominal swelling? No. Pain Score  0 *  Have you tolerated food without any problems? Yes.    Have you been able to return to your normal activities? Yes.    Do you have any questions about your discharge instructions: Diet   No. Medications  No. Follow up visit  No.  Do you have questions or concerns about your Care? No.  Actions: * If pain score is 4 or above: No action needed, pain <4. Have you developed a fever since your procedure? no  2.   Have you had an respiratory symptoms (SOB or cough) since your procedure? no  3.   Have you tested positive for COVID 19 since your procedure no  4.   Have you had any family members/close contacts diagnosed with the COVID 19 since your procedure?  no   If yes to any of these questions please route to Joylene John, RN and Joella Prince, RN

## 2021-07-06 ENCOUNTER — Encounter: Payer: Self-pay | Admitting: Internal Medicine

## 2021-10-27 ENCOUNTER — Other Ambulatory Visit: Payer: Self-pay | Admitting: Family Medicine

## 2021-10-28 NOTE — Telephone Encounter (Signed)
Changing dose to 1 pill daily based on last uric acid level in April.  Will need to notify pt

## 2021-10-28 NOTE — Telephone Encounter (Signed)
Called pt and advised him of the change in dosage to 1 pill daily.  Pt verbalized understanding.

## 2021-12-07 ENCOUNTER — Other Ambulatory Visit: Payer: Self-pay | Admitting: Family Medicine

## 2021-12-07 NOTE — Telephone Encounter (Signed)
Rx refill request approved per Dr. Corey's orders. 

## 2021-12-08 ENCOUNTER — Telehealth: Payer: Self-pay

## 2021-12-08 ENCOUNTER — Encounter (HOSPITAL_BASED_OUTPATIENT_CLINIC_OR_DEPARTMENT_OTHER): Payer: Self-pay

## 2021-12-08 ENCOUNTER — Emergency Department (HOSPITAL_BASED_OUTPATIENT_CLINIC_OR_DEPARTMENT_OTHER)
Admission: EM | Admit: 2021-12-08 | Discharge: 2021-12-08 | Disposition: A | Discharge status: Home / Self Care | Payer: Self-pay | Attending: Emergency Medicine | Admitting: Emergency Medicine

## 2021-12-08 ENCOUNTER — Other Ambulatory Visit: Payer: Self-pay

## 2021-12-08 ENCOUNTER — Emergency Department (HOSPITAL_BASED_OUTPATIENT_CLINIC_OR_DEPARTMENT_OTHER): Payer: Self-pay | Admitting: Radiology

## 2021-12-08 DIAGNOSIS — M25562 Pain in left knee: Secondary | ICD-10-CM | POA: Insufficient documentation

## 2021-12-08 DIAGNOSIS — M25462 Effusion, left knee: Secondary | ICD-10-CM | POA: Insufficient documentation

## 2021-12-08 DIAGNOSIS — Z87891 Personal history of nicotine dependence: Secondary | ICD-10-CM | POA: Insufficient documentation

## 2021-12-08 DIAGNOSIS — N189 Chronic kidney disease, unspecified: Secondary | ICD-10-CM | POA: Insufficient documentation

## 2021-12-08 HISTORY — DX: Rheumatoid arthritis, unspecified: M06.9

## 2021-12-08 MED ORDER — OXYCODONE-ACETAMINOPHEN 5-325 MG PO TABS: 1.0000 | ORAL_TABLET | Freq: Four times a day (QID) | ORAL | 0 refills | Status: AC | PRN

## 2021-12-08 MED ORDER — KETOROLAC TROMETHAMINE 30 MG/ML IJ SOLN
30.0000 mg | Freq: Once | INTRAMUSCULAR | Status: AC
  Administered 2021-12-08: 30 mg via INTRAMUSCULAR
  Filled 2021-12-08: qty 1

## 2021-12-08 NOTE — ED Triage Notes (Signed)
Pt presents with Left knee pain x4 days. Pt has a hx of gout and RA

## 2021-12-08 NOTE — ED Provider Notes (Addendum)
[facility] EMERGENC[facility] DEPT Provider Note   CSN: [id number] Arrival date & time: 12/08/21  1855     History Chief Complaint  Patient presents with   Knee Pain    Elijah King is a 50 y.o. male.  50 year old male with a history of solitary kidney, rheumatoid arthritis, gout presents today for evaluation of left knee pain of 1 week duration that significantly worsened today.  Patient reports he has history of same with most recent flareup 9 months ago which was similar in nature and just as severe.  He reports he has not been able to ambulate without significant pain today.  He endorses swelling.  He does follow with rheumatologist but states he called the office and was not able to get a response today.  He denies fever, chills, nausea.  He states he has had multiple arthrocentesis without success in the past and does not want to try one today.  He states his uric acid has been well controlled.  He states last time he had this flareup Toradol injection he received in the emergency room provided him significant relief, to the point he was able to return to work the day after.  The history is provided by the patient. No language interpreter was used.      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   Genetic testing 04/06/2021   Family history of breast cancer 03/31/2021   Family history of ovarian cancer 03/31/2021   Family history of genetic disease carrier    Elbow swelling, left 03/03/2021   Pes anserine bursitis 07/12/2020   Elevated blood pressure reading 03/28/2017   Snoring 03/28/2017   Solitary kidney, acquired 03/28/2017   Chronic kidney disease 03/28/2017    Former smoker 03/28/2017   Obesity (BMI 30-39.9) 03/28/2017   Gastroesophageal reflux disease 03/28/2017   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    Past Surgical History:  Procedure Laterality Date   KIDNEY CYST REMOVAL Left 1983   NEPHRECTOMY Left 1983       Family History  Problem Relation Age of Onset   Anuerysm Father    Lung cancer Father    Breast cancer Maternal Aunt    Ovarian cancer Maternal Aunt    Breast cancer Other        MGMs mother dx in her 69s   Colon cancer Neg Hx    Prostate cancer Neg Hx    Colon polyps Neg Hx    Esophageal cancer Neg Hx    Rectal cancer Neg Hx    Stomach cancer Neg Hx     Social History   Tobacco Use   Smoking status: Former    Packs/day: 0.50    Years: 30.00    Pack years: 15.00    Types: Cigarettes    Start date: 03/03/1989    Quit date: 12/25/2017    Years since quitting: 3.9   Smokeless tobacco: Never  Vaping Use   Vaping Use: Some days  Substance Use Topics   Alcohol use: Yes    Alcohol/week: 0.0 standard drinks    Comment: twice weekly 5 drinks total   Drug use: No  Home Medications Prior to Admission medications   Medication Sig Start Date End Date Taking? Authorizing Provider  allopurinol (ZYLOPRIM) 300 MG tablet TAKE 1 TABLET(300 MG) BY MOUTH DAILY 12/07/21   Gregor Hams, MD  colchicine 0.6 MG tablet Take 1 tablet (0.6 mg total) by mouth daily. Patient not taking: Reported on 06/24/2021 01/13/21   Gregor Hams, MD  HYDROcodone-acetaminophen (NORCO/VICODIN) 5-325 MG tablet Take 1 tablet by mouth every 6 (six) hours as needed for severe pain. Patient not taking: Reported on 06/24/2021 05/01/21   Truddie Hidden, MD  omeprazole (PRILOSEC) 20 MG capsule Take 20 mg by mouth as needed.    [provider]    Allergies    Patient has no known allergies.  Review of Systems   Review of Systems  Constitutional:  Negative for chills and fever.  Respiratory:  Negative for shortness of breath.   Musculoskeletal:   Positive for arthralgias, gait problem (Antalgic) and joint swelling.  Neurological:  Negative for weakness.  All other systems reviewed and are negative.  Physical Exam Updated Vital Signs BP (!) 130/105    Pulse (!) 114    Temp 99.2 F (37.3 C)    Resp 18    Ht 6' (1.829 m)    Wt 99.8 kg    SpO2 98%    BMI 29.84 kg/m   Physical Exam Vitals and nursing note reviewed.  Constitutional:      General: He is not in acute distress.    Appearance: Normal appearance. He is obese.     Comments: Uncomfortable appearing  HENT:     Head: Normocephalic and atraumatic.     Nose: Nose normal.  Eyes:     General: No scleral icterus.    Extraocular Movements: Extraocular movements intact.     Conjunctiva/sclera: Conjunctivae normal.  Cardiovascular:     Rate and Rhythm: Normal rate and regular rhythm.     Pulses: Normal pulses.     Heart sounds: Normal heart sounds.  Pulmonary:     Effort: Pulmonary effort is normal. No respiratory distress.     Breath sounds: Normal breath sounds. No wheezing or rales.  Musculoskeletal:        General: Tenderness present. Normal range of motion.     Cervical back: Normal range of motion.     Comments: Left knee with visible swelling.  Without erythema.  Limited range of motion secondary to pain.  2+ DP pulse present.  Bilateral hips without tenderness to palpation, bilateral ankles with patient.  Skin:    General: Skin is warm and dry.  Neurological:     General: No focal deficit present.     Mental Status: He is alert. Mental status is at baseline.    ED Results / Procedures / Treatments   Labs (all labs ordered are listed, but only abnormal results are displayed) Labs Reviewed - No data to display  EKG None  Radiology DG Knee Complete 4 Views Left  Result Date: 12/08/2021 CLINICAL DATA:  Knee pain EXAM: LEFT KNEE - COMPLETE 4+ VIEW COMPARISON:  07/09/2020 FINDINGS: No fracture or malalignment. Small knee effusion suspected. Generalized edema.  The joint spaces appear patent. IMPRESSION: Edema with probable small effusion but no definite acute osseous abnormality. Electronically Signed   By: Donavan Foil M.D.   On: 12/08/2021 19:56    Procedures Procedures   Medications Ordered in ED Medications  ketorolac (TORADOL) 30 MG/ML injection 30 mg (has no administration in time range)  ED Course  I have reviewed the triage vital signs and the nursing notes.  Pertinent labs & imaging results that were available during my care of the patient were reviewed by me and considered in my medical decision making (see chart for details).    MDM Rules/Calculators/A&P                         50 year old male presents today for evaluation of left knee pain has been progressively worsening with significant worsening today.  He reached out to his rheumatologist office and did not hear anything so he presented to the emergency room.  He reports this flareup is similar to his previous without any significant change.  Today he is currently without any visible erythema or warmth however he also had an ice pack on it while he was in the waiting room.  With similar episodes in the past he has had arthrocentesis without success.  For this reason we will defer this today.  He does have significant pain with bearing weight.  Left knee x-ray with mild effusion otherwise unremarkable.  Patient given Toradol 30 mg in the emergency room.  He does not take NSAIDs chronically.  I did discuss with him to drink plenty of fluids and hydrate well.  His last creatinine was 1.21 in April and has remained around that before that as well.  We will also give him Percocet to assist with pain over the next few days.    On reevaluation following Toradol injection patient reports mild improvement in pain already.  Discussed importance of follow-up with his rheumatologist.  Patient voices understanding and is in agreement with plan.  Final Clinical Impression(s) / ED  Diagnoses Final diagnoses:  None    Rx / DC Orders ED Discharge Orders     None        Evlyn Courier, PA-C 12/08/21 2117    Evlyn Courier, PA-C 12/08/21 2118    Lacretia Leigh, MD 12/09/21 [id number]

## 2021-12-08 NOTE — Discharge Instructions (Signed)
Your knee x-ray showed mild swelling otherwise unremarkable.  He got Toradol injection in the emergency room.  Given your history of chronic kidney disease and solitary kidney make sure you drink plenty of fluids.  I have also sent in pain medication to the pharmacy for you.  If you have worsening symptoms please return to the emergency room.  Otherwise follow-up with your rheumatologist.

## 2021-12-08 NOTE — ED Notes (Signed)
RN provided AVS using Teachback Method. Patient verbalizes understanding of Discharge Instructions. Opportunity for Questioning and Answers were provided by RN. Patient Discharged from ED in wheelchair to Home with family.

## 2021-12-08 NOTE — Telephone Encounter (Signed)
Patient called stating approximately 2 weeks ago he began experiencing left knee pain and swelling.  Patient states he wants to talk to Dr. Benjamine Mola "to see what he has to say" before he schedules an appointment.

## 2021-12-09 NOTE — Telephone Encounter (Signed)
Attempted to contact the patient and left message for patient to call the office.  

## 2021-12-09 NOTE — Telephone Encounter (Signed)
Left voicemail asking him to return call to the office to discuss. From chart review, we recommend starting methotrexate for inflammatory arthritis in multiple areas in May but never followed up after that.

## 2021-12-12 ENCOUNTER — Encounter: Payer: Self-pay | Admitting: Internal Medicine

## 2021-12-12 ENCOUNTER — Ambulatory Visit (INDEPENDENT_AMBULATORY_CARE_PROVIDER_SITE_OTHER): Payer: 59 | Admitting: Internal Medicine

## 2021-12-12 ENCOUNTER — Other Ambulatory Visit: Payer: Self-pay

## 2021-12-12 VITALS — BP 145/88 | HR 102 | Ht 72.0 in | Wt 222.6 lb

## 2021-12-12 DIAGNOSIS — M109 Gout, unspecified: Secondary | ICD-10-CM | POA: Diagnosis not present

## 2021-12-12 DIAGNOSIS — M1380 Other specified arthritis, unspecified site: Secondary | ICD-10-CM

## 2021-12-12 DIAGNOSIS — Z905 Acquired absence of kidney: Secondary | ICD-10-CM | POA: Diagnosis not present

## 2021-12-12 DIAGNOSIS — Z79899 Other long term (current) drug therapy: Secondary | ICD-10-CM

## 2021-12-12 MED ORDER — PREDNISONE 10 MG PO TABS: ORAL_TABLET | ORAL | 0 refills | Status: DC

## 2021-12-12 MED ORDER — FOLIC ACID 1 MG PO TABS: 1.0000 mg | ORAL_TABLET | Freq: Every day | ORAL | 0 refills | Status: DC

## 2021-12-12 MED ORDER — METHOTREXATE 2.5 MG PO TABS: 15.0000 mg | ORAL_TABLET | ORAL | 0 refills | Status: AC

## 2021-12-12 NOTE — Telephone Encounter (Signed)
Spoke with patient and he states he has been in extreme pain all weekend. Patient advised from chart review, Dr. Benjamine Mola recommends starting methotrexate for inflammatory arthritis in multiple areas in May but never followed up after that. Patient scheduled for an appointment today at 1:20 pm.

## 2021-12-12 NOTE — Progress Notes (Signed)
Office Visit Note  Patient: Elijah King             Date of Birth: 02/18/1971           MRN: [id number]             PCP: [name], PA Referring: [name], PA Visit Date: 12/12/2021   Subjective:   History of Present Illness: Elijah King is a 50 y.o. male here for follow up for seronegative inflammatory arthritis with recurrence of left knee pain and swelling. He discontinued methotrexate after one month back in May and not on long term treatment. No significant joint pain problems during the interval until left knee pain started again abruptly about a week ago. This is worst on the medial side of the joint, aching both at rest and with weightbearing. He saw the ED with xray showing soft tissue swelling not much else. He received IM toradol overall symptoms have not improved much, currently causing him to miss work. No other systemic complaints.  Previous HPI 04/27/21 Elijah King is a 50 y.o. male here for follow up for inflammatory joint pain in multiple sites now involving left knee. He has history of polyarticular gout but uric acid levels have been well controlled and continuing to flare up. He was just seen for ankle and foot pain and swelling with steroid injection treatment about a week ago and started on a prednisone taper. He finished the prednisone taper but he continues to have severe pain with redness, heat, and swelling in the right knee.  Previous HPI 04/19/21 Elijah King is a 50 y.o. male with a history of polyarticular gout since decades ago on allopurinol 600 mg per day here with acute flare of pain since 2 days ago. Symptoms started at left lateral ankle and foot with subsequent expansion to involve left foot toes and whole ankle. Now also feeling right knee pain but much less than the left side symptoms. His last uric acid level was 3.2 in February and he denies missing or changing his medication since then. He is not currently on any colchicine.  Review  of Systems  Constitutional:  Positive for fatigue.  HENT:  Negative for mouth sores, mouth dryness and nose dryness.   Eyes:  Negative for pain, itching and dryness.  Respiratory:  Negative for shortness of breath and difficulty breathing.   Cardiovascular:  Negative for chest pain and palpitations.  Gastrointestinal:  Negative for blood in stool, constipation and diarrhea.  Endocrine: Negative for increased urination.  Genitourinary:  Negative for difficulty urinating.  Musculoskeletal:  Positive for joint pain, joint pain, joint swelling and morning stiffness. Negative for myalgias, muscle tenderness and myalgias.  Skin:  Negative for color change, rash and redness.  Allergic/Immunologic: Negative for susceptible to infections.  Neurological:  Positive for weakness. Negative for dizziness, numbness, headaches and memory loss.  Hematological:  Negative for bruising/bleeding tendency.  Psychiatric/Behavioral:  Negative for confusion.    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   Genetic testing 04/06/2021   Family history of breast cancer 03/31/2021   Family history of ovarian cancer 03/31/2021   Family history of genetic disease carrier    Elbow swelling, left 03/03/2021   Pes anserine bursitis 07/12/2020   Elevated blood pressure reading 03/28/2017   Snoring 03/28/2017   Solitary kidney, acquired 03/28/2017   Chronic kidney disease 03/28/2017  Former smoker 03/28/2017   Obesity (BMI 30-39.9) 03/28/2017   Gastroesophageal reflux disease 03/28/2017   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    Past Medical History:  Diagnosis Date   Chronic kidney disease    only 1 kidney   Family history of genetic disease carrier    GERD (gastroesophageal reflux disease)    Gout    RA (rheumatoid arthritis) ([place])     Family History  Problem Relation Age of Onset   Anuerysm Father    Lung cancer  Father    Breast cancer Maternal Aunt    Ovarian cancer Maternal Aunt    Breast cancer Other        MGMs mother dx in her 64s   Colon cancer Neg Hx    Prostate cancer Neg Hx    Colon polyps Neg Hx    Esophageal cancer Neg Hx    Rectal cancer Neg Hx    Stomach cancer Neg Hx    Past Surgical History:  Procedure Laterality Date   KIDNEY CYST REMOVAL Left 1983   NEPHRECTOMY Left 1983   Social History   Social History Narrative   Has a desk job   Live in [place]   Married, 1 child (son)   Immunization History  Administered Date(s) Administered   Influenza Split 01/02/2013   Influenza,inj,Quad PF,6+ Mos 09/13/2018   Influenza-Unspecified 09/24/2019   Tdap 03/28/2017     Objective: Vital Signs: BP (!) 145/88 (BP Location: Right Arm, Patient Position: Sitting, Cuff Size: Large)    Pulse (!) 102    Ht 6' (1.829 m)    Wt 222 lb 9.6 oz (101 kg)    BMI 30.19 kg/m    Physical Exam Cardiovascular:     Rate and Rhythm: Regular rhythm. Tachycardia present.  Pulmonary:     Effort: Pulmonary effort is normal.     Breath sounds: Normal breath sounds.  Musculoskeletal:     Right lower leg: No edema.     Left lower leg: No edema.  Skin:    General: Skin is warm and dry.  Neurological:     Mental Status: He is alert.  Psychiatric:     Comments: Visibly uncomfortable and mildly sweating worse with joint manipulation     Musculoskeletal Exam:  Elbows full ROM no tenderness or swelling Wrists full ROM no tenderness or swelling Fingers full ROM no tenderness or swelling Left knee with erythema warmth and tenderness to pressure over medial aspect of joint extending around anteriorly to lateral of patellar tendon, no large suprapatellar fluid effusion, ROM guarded due to pain Limited ultrasound inspection without evidence of large organized fluid collection, no venous changes with incompressibility Ankles full ROM no tenderness or swelling   CDAI Exam: CDAI Score: 12  Patient Global:  70 mm; Provider Global: 30 mm Swollen: 1 ; Tender: 1  Joint Exam 12/12/2021      Right  Left  Knee     Swollen Tender     Investigation: No additional findings.  Imaging: DG Knee Complete 4 Views Left  Result Date: 12/08/2021 CLINICAL DATA:  Knee pain EXAM: LEFT KNEE - COMPLETE 4+ VIEW COMPARISON:  07/09/2020 FINDINGS: No fracture or malalignment. Small knee effusion suspected. Generalized edema. The joint spaces appear patent. IMPRESSION: Edema with probable small effusion but no definite acute osseous abnormality. Electronically Signed   By: Donavan Foil M.D.   On: 12/08/2021 19:56    Recent Labs: Lab Results  Component Value Date   WBC 7.6 04/04/2021  HGB 15.2 04/04/2021   PLT 201.0 04/04/2021   NA 139 04/04/2021   K 4.6 04/04/2021   CL 102 04/04/2021   CO2 28 04/04/2021   GLUCOSE 85 04/04/2021   BUN 13 04/04/2021   CREATININE 1.21 04/04/2021   BILITOT 0.4 04/04/2021   ALKPHOS 82 04/04/2021   AST 26 04/04/2021   ALT 39 04/04/2021   PROT 7.3 04/04/2021   ALBUMIN 4.5 04/04/2021   CALCIUM 9.8 04/04/2021   GFRAA 81 05/13/2016   QFTBGOLDPLUS NEGATIVE 04/27/2021    Speciality Comments: No specialty comments available.  Procedures:  No procedures performed Allergies: Patient has no known allergies.   Assessment / Plan:     Visit Diagnoses: Seronegative arthritis - Plan: Sedimentation rate, methotrexate (RHEUMATREX) 2.5 MG tablet, folic acid (FOLVITE) 1 MG tablet, predniSONE (DELTASONE) 10 MG tablet  Recurrent inflammatory monoarthritis no evidence of infections or crystalline disease but also no structural cause on MRI and has occurred in a few different areas.  Rechecking sed rate today.  He would like to avoid repeated joint injections concerned about long-term risks from this.  We will start prednisone taper from 60 mg over the next week.  Starting methotrexate 15 mg p.o. weekly and folic acid 1 mg daily currently suspecting seronegative arthritis.  Solitary  kidney, acquired  Solitary kidney but has near normal baseline renal function okay for starting DMARD treatment at this time.  Will require close monitoring.  High risk medication use - Plan: CBC with Differential/Platelet, COMPLETE METABOLIC PANEL WITH GFR  Discussed trial of starting methotrexate treatment we will check CBC and CMP for baseline medication monitoring.  Previously reviewed hepatitis serology and negative TB screening from May labs.  Polyarticular gout  Doubly clinical picture is consistent with gout based on very low previous uric acid and lack of monosodium urate crystals on aspirated joint.  Also lack of response so far to oral colchicine.  No particular evidence for alternate crystalline arthropathy either such as calcium oxalate disease with no chondrocalcinosis on x-ray imaging or abnormal serology.  Orders: Orders Placed This Encounter  Procedures   CBC with Differential/Platelet   COMPLETE METABOLIC PANEL WITH GFR   Sedimentation rate   Meds ordered this encounter  Medications   methotrexate (RHEUMATREX) 2.5 MG tablet    Sig: Take 6 tablets (15 mg total) by mouth once a week. Caution:Chemotherapy. Protect from light.    Dispense:  30 tablet    Refill:  0   folic acid (FOLVITE) 1 MG tablet    Sig: Take 1 tablet (1 mg total) by mouth daily.    Dispense:  90 tablet    Refill:  0   predniSONE (DELTASONE) 10 MG tablet    Sig: Take once daily by mouth: 6 tablets, 5 tablets, 4 tablets, 3 tablets, 2 tablets, 1 tablet    Dispense:  21 tablet    Refill:  0     Follow-Up Instructions: No follow-ups on file.   Collier Salina, MD  Note - This record has been created using [employer].  Chart creation errors have been sought, but may not always  have been located. Such creation errors do not reflect on  the standard of medical care.

## 2021-12-13 LAB — CBC WITH DIFFERENTIAL/PLATELET
Absolute Monocytes: 749 cells/uL (ref 200–950)
Basophils Absolute: 49 cells/uL (ref 0–200)
Basophils Relative: 0.7 %
Eosinophils Absolute: 273 cells/uL (ref 15–500)
Eosinophils Relative: 3.9 %
HCT: 46.6 % (ref 38.5–50.0)
Hemoglobin: 15.5 g/dL (ref 13.2–17.1)
Lymphs Abs: 1820 cells/uL (ref 850–3900)
MCH: 30.5 pg (ref 27.0–33.0)
MCHC: 33.3 g/dL (ref 32.0–36.0)
MCV: 91.7 fL (ref 80.0–100.0)
MPV: 10.6 fL (ref 7.5–12.5)
Monocytes Relative: 10.7 %
Neutro Abs: 4109 cells/uL (ref 1500–7800)
Neutrophils Relative %: 58.7 %
Platelets: 262 10*3/uL (ref 140–400)
RBC: 5.08 10*6/uL (ref 4.20–5.80)
RDW: 12.3 % (ref 11.0–15.0)
Total Lymphocyte: 26 %
WBC: 7 10*3/uL (ref 3.8–10.8)

## 2021-12-13 LAB — COMPLETE METABOLIC PANEL WITH GFR
AG Ratio: 1.4 (calc) (ref 1.0–2.5)
ALT: 39 U/L (ref 9–46)
AST: 36 U/L (ref 10–40)
Albumin: 4.6 g/dL (ref 3.6–5.1)
Alkaline phosphatase (APISO): 79 U/L (ref 36–130)
BUN/Creatinine Ratio: 15 (calc) (ref 6–22)
BUN: 21 mg/dL (ref 7–25)
CO2: 27 mmol/L (ref 20–32)
Calcium: 10.5 mg/dL — ABNORMAL HIGH (ref 8.6–10.3)
Chloride: 101 mmol/L (ref 98–110)
Creat: 1.43 mg/dL — ABNORMAL HIGH (ref 0.60–1.29)
Globulin: 3.3 g/dL (calc) (ref 1.9–3.7)
Glucose, Bld: 90 mg/dL (ref 65–99)
Potassium: 5.2 mmol/L (ref 3.5–5.3)
Sodium: 140 mmol/L (ref 135–146)
Total Bilirubin: 0.3 mg/dL (ref 0.2–1.2)
Total Protein: 7.9 g/dL (ref 6.1–8.1)
eGFR: 60 mL/min/{1.73_m2} (ref >=60)

## 2021-12-13 LAB — SEDIMENTATION RATE: Sed Rate: 28 mm/h — ABNORMAL HIGH (ref 0–15)

## 2022-01-11 IMAGING — US US ABDOMEN LIMITED
1 series · 14 of 25 positions shown · non-contrast
Comparison: None.

CLINICAL DATA: Right upper quadrant pain for 5 days.

EXAM:
ULTRASOUND ABDOMEN LIMITED RIGHT UPPER QUADRANT

[Series 1: us abdomen limited · 14 of 70 slices shown]
[im 1/70]
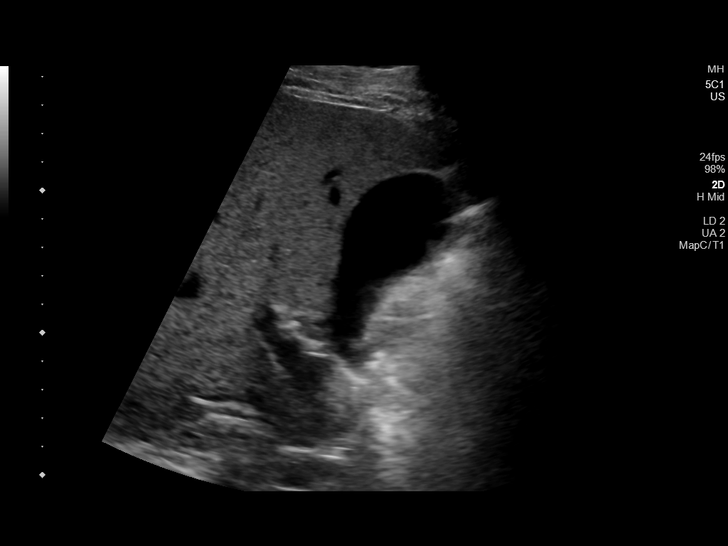
[im 6/70]
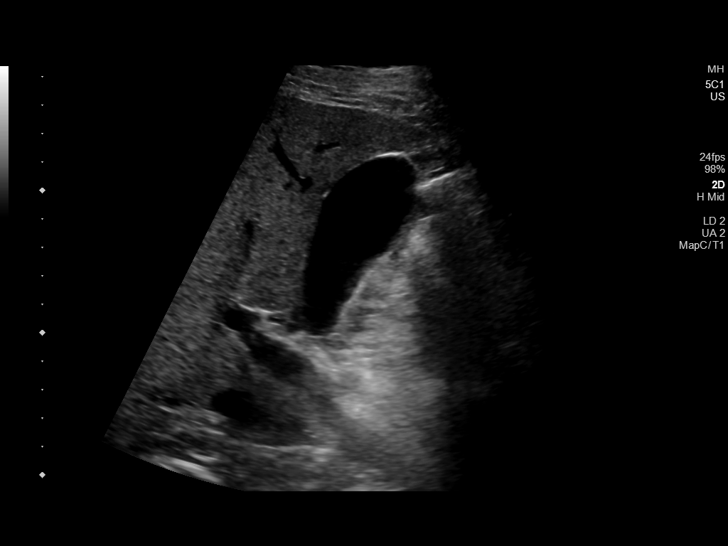
[im 12/70]
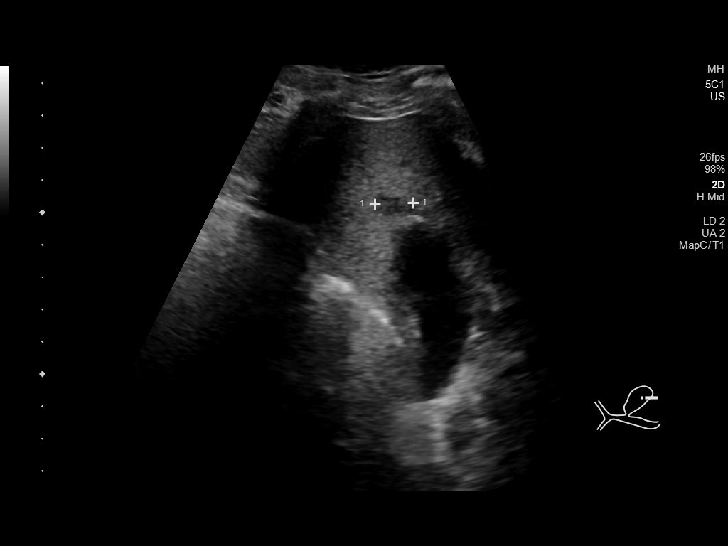
[im 18/70]
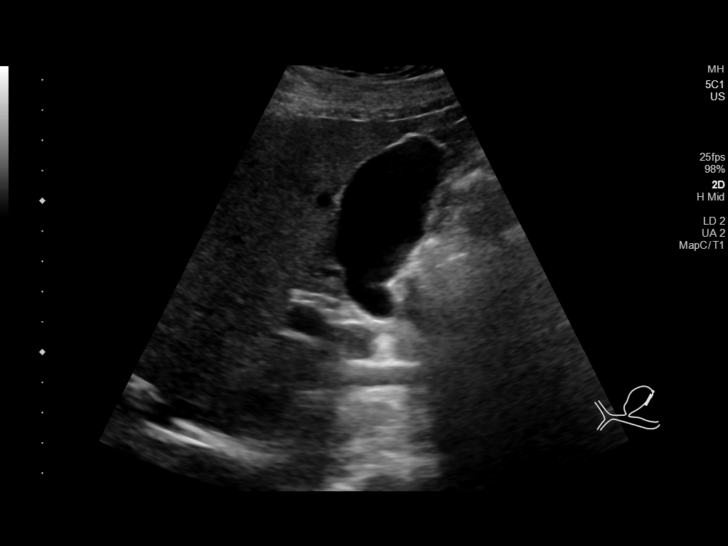
[im 24/70]
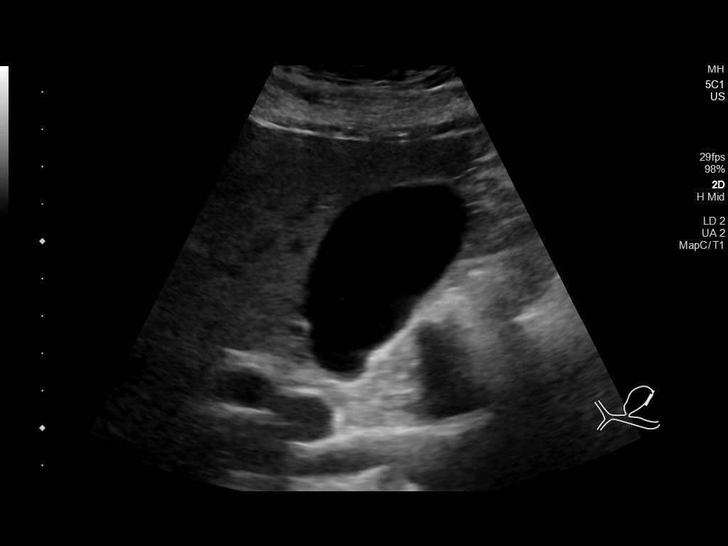
[im 26/70]
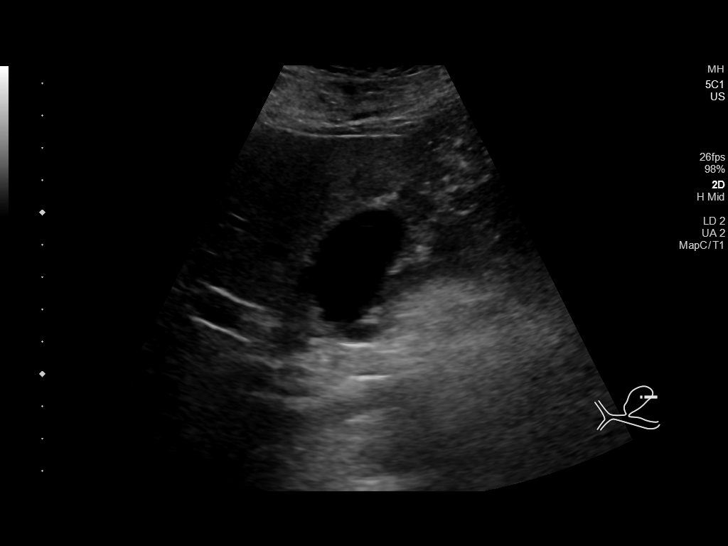
[im 32/70]
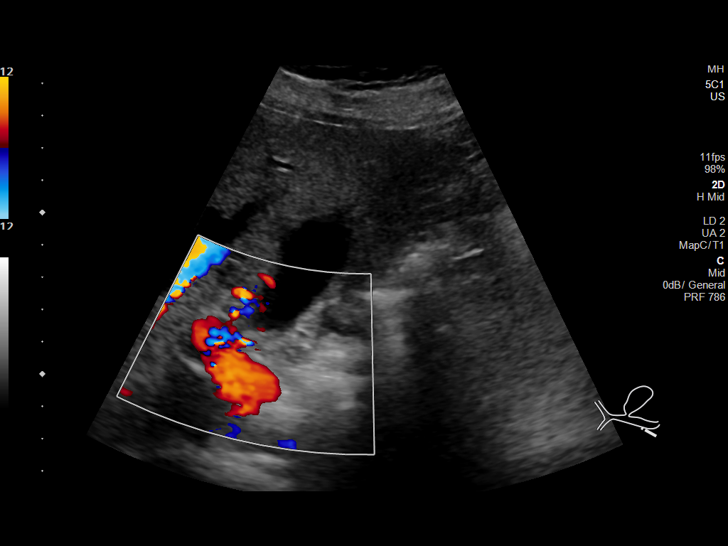
[im 38/70]
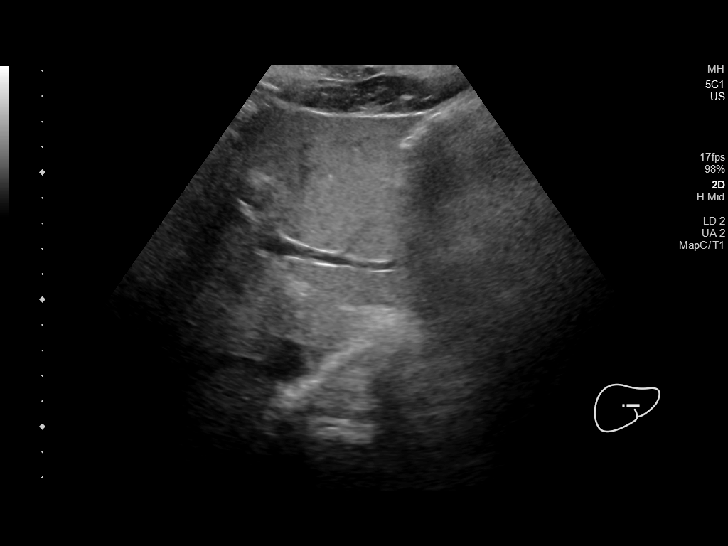
[im 44/70]
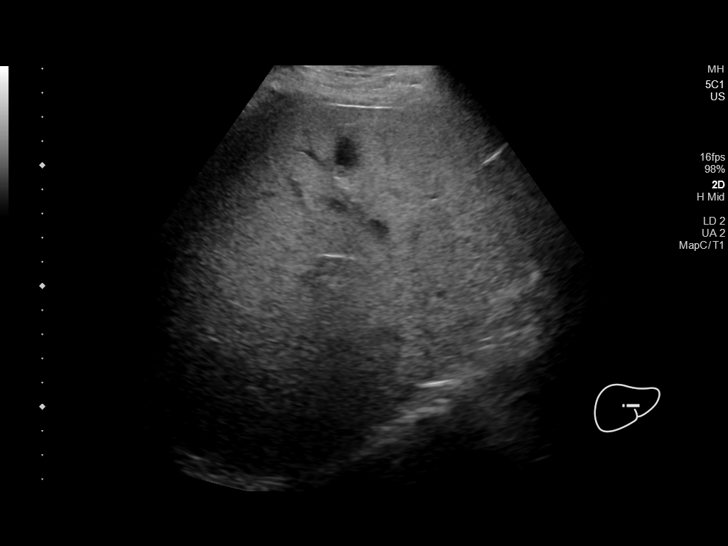
[im 47/70]
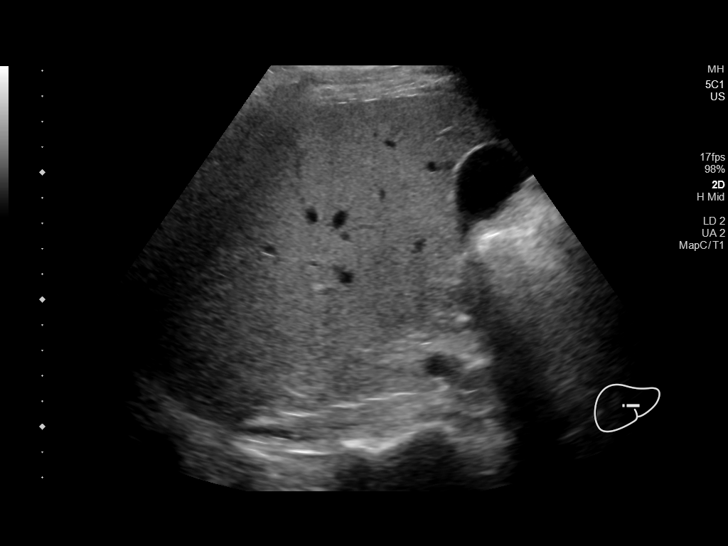
[im 52/70]
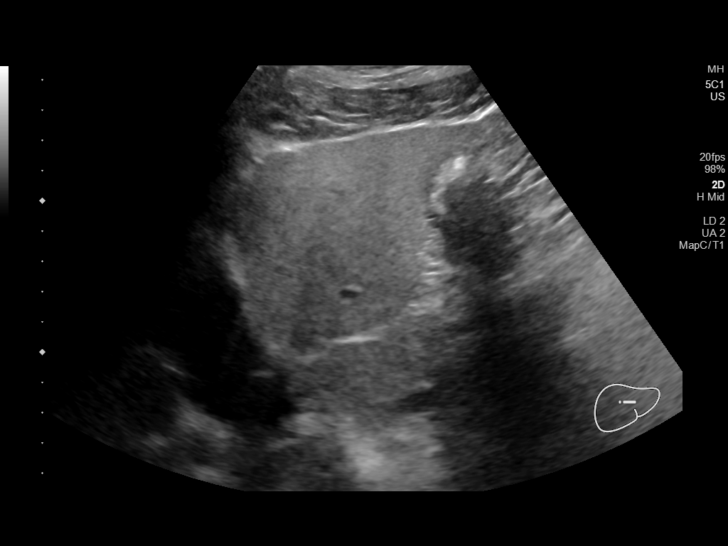
[im 58/70]
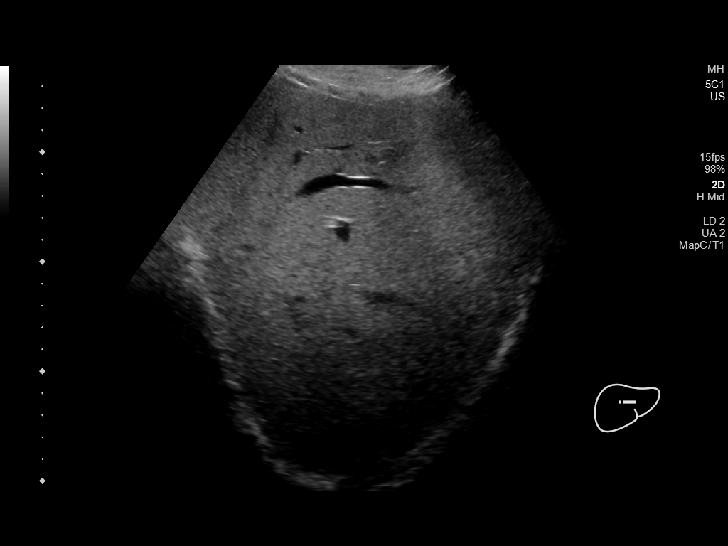
[im 64/70]
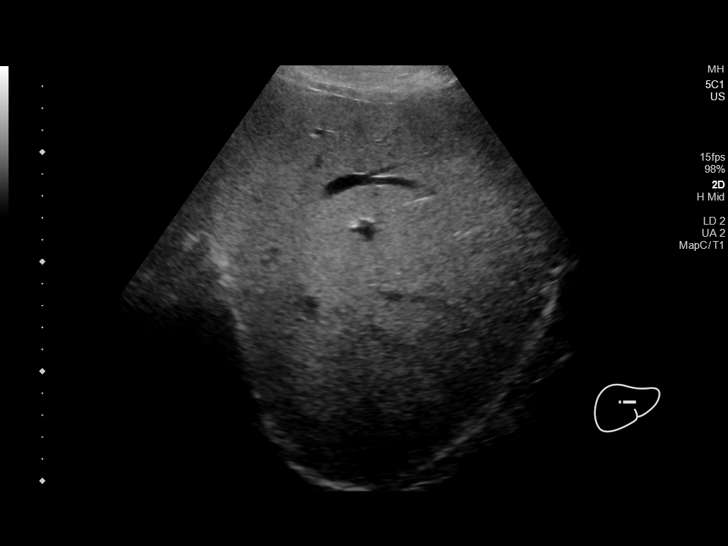
[im 70/70]
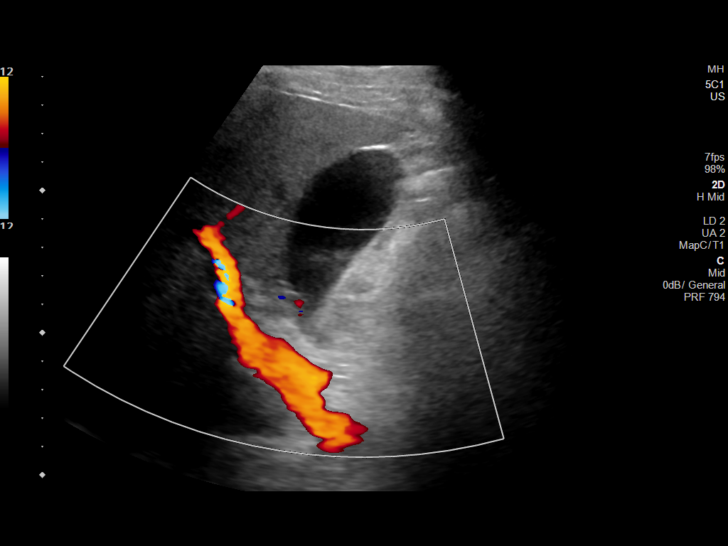

[14 of 25 positions shown; findings below may reference images not displayed]

FINDINGS: Gallbladder:

No gallstones or wall thickening visualized. No sonographic Murphy
sign noted by sonographer.

Common bile duct:

Diameter: 0.3 cm

Liver:

No focal lesion. Increased echogenicity is consistent with fatty
infiltration. Focal fatty sparing adjacent to the gallbladder
incidentally noted. Portal vein is patent on color Doppler imaging
with normal direction of blood flow towards the liver.

Other: None.
IMPRESSION: Negative for gallstones.  No acute abnormality.

Fatty infiltration of the liver.

## 2022-01-17 ENCOUNTER — Telehealth: Payer: Self-pay | Admitting: Internal Medicine

## 2022-01-17 NOTE — Telephone Encounter (Signed)
Patient left a voicemail stating he had finished the medication he was recently prescribed by Dr. Benjamine Mola and is still have issues with his knees. Patient would like advise on what to do since he still is having problems.

## 2022-01-17 NOTE — Telephone Encounter (Signed)
The plan was to have follow up labs within 1 month of starting methotrexate, he is now 1 month since starting. Needs CBC and CMP rechecked. If symptoms aren't tolerable right now we can schedule a follow up visit soon. Otherwise would schedule clinic follow up in another 2 months.

## 2022-01-18 NOTE — Telephone Encounter (Signed)
Spoke with patient and advised the plan was to have follow up labs within 1 month of starting methotrexate, he is now 1 month since starting. Needs CBC and CMP rechecked. If symptoms aren't tolerable right now we can schedule a follow up visit soon. Otherwise would schedule clinic follow up in another 2 months. Patient scheduled to be seen on 01/24/2022.

## 2022-01-23 NOTE — Progress Notes (Signed)
Office Visit Note  Patient: Elijah King             Date of Birth: 1971-06-01           MRN: [id number]             PCP: [name], PA Referring: [name], PA Visit Date: 01/24/2022   Subjective:  Joint Pain (Feels better with medication/ finished prednisone taper, on Methotrexate )   History of Present Illness: Elijah King is a 51 y.o. male here for follow up for seronegative arthritis after starting methotrexate 15 mg PO weekly last month and with initial prednisone taper.  He has noticed a large improvement in joint pain and stiffness with the medications just finished the prednisone in the past few days.  Tolerating methotrexate without any particular complaints.  Previous HPI 12/12/21 Elijah King is a 51 y.o. male here for follow up for seronegative inflammatory arthritis with recurrence of left knee pain and swelling. He discontinued methotrexate after one month back in May and not on long term treatment. No significant joint pain problems during the interval until left knee pain started again abruptly about a week ago. This is worst on the medial side of the joint, aching both at rest and with weightbearing. He saw the ED with xray showing soft tissue swelling not much else. He received IM toradol overall symptoms have not improved much, currently causing him to miss work. No other systemic complaints.   Previous HPI 04/27/21 Elijah King is a 51 y.o. male here for follow up for inflammatory joint pain in multiple sites now involving left knee. He has history of polyarticular gout but uric acid levels have been well controlled and continuing to flare up. He was just seen for ankle and foot pain and swelling with steroid injection treatment about a week ago and started on a prednisone taper. He finished the prednisone taper but he continues to have severe pain with redness, heat, and swelling in the right knee.   Previous HPI 04/19/21 Elijah King is a 51 y.o.  male with a history of polyarticular gout since decades ago on allopurinol 600 mg per day here with acute flare of pain since 2 days ago. Symptoms started at left lateral ankle and foot with subsequent expansion to involve left foot toes and whole ankle. Now also feeling right knee pain but much less than the left side symptoms. His last uric acid level was 3.2 in February and he denies missing or changing his medication since then. He is not currently on any colchicine.     Review of Systems  Constitutional:  Positive for fatigue.  HENT:  Positive for ear pain.   Eyes: Negative.   Respiratory: Negative.    Cardiovascular: Negative.   Gastrointestinal: Negative.   Endocrine: Negative.   Genitourinary: Negative.   Musculoskeletal:  Positive for joint pain (improving but still has pain with ambulation or getting out of a chair), gait problem, joint pain (improving but still has pain with ambulation or getting out of a chair), joint swelling and morning stiffness (last for a few minutes better with moving around).  Skin: Negative.   Allergic/Immunologic:       Denies recent infections   Hematological: Negative.   Psychiatric/Behavioral: Negative.      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   Genetic testing 04/06/2021  Family history of breast cancer 03/31/2021   Family history of ovarian cancer 03/31/2021   Family history of genetic disease carrier    Elbow swelling, left 03/03/2021   Pes anserine bursitis 07/12/2020   Elevated blood pressure reading 03/28/2017   Snoring 03/28/2017   Solitary kidney, acquired 03/28/2017   Chronic kidney disease 03/28/2017   Former smoker 03/28/2017   Obesity (BMI 30-39.9) 03/28/2017   Gastroesophageal reflux disease 03/28/2017   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    Past Medical History:  Diagnosis Date   Chronic kidney disease    only 1 kidney    Family history of genetic disease carrier    GERD (gastroesophageal reflux disease)    Gout    RA (rheumatoid arthritis) ([place])     Family History  Problem Relation Age of Onset   Anuerysm Father    Lung cancer Father    Breast cancer Maternal Aunt    Ovarian cancer Maternal Aunt    Breast cancer Other        MGMs mother dx in her 12s   Colon cancer Neg Hx    Prostate cancer Neg Hx    Colon polyps Neg Hx    Esophageal cancer Neg Hx    Rectal cancer Neg Hx    Stomach cancer Neg Hx    Past Surgical History:  Procedure Laterality Date   KIDNEY CYST REMOVAL Left 1983   NEPHRECTOMY Left 1983   Social History   Social History Narrative   Has a desk job   Live in [place]   Married, 1 child (son)   Immunization History  Administered Date(s) Administered   Influenza Split 01/02/2013   Influenza,inj,Quad PF,6+ Mos 09/13/2018   Influenza-Unspecified 09/24/2019   Tdap 03/28/2017     Objective: Vital Signs: BP 128/87   Pulse 97   Ht 6' (1.829 m)   Wt 225 lb (102.1 kg)   BMI 30.52 kg/m    Physical Exam Constitutional:      Appearance: He is obese.  Cardiovascular:     Rate and Rhythm: Normal rate and regular rhythm.  Pulmonary:     Effort: Pulmonary effort is normal.     Breath sounds: Normal breath sounds.  Musculoskeletal:     Right lower leg: No edema.     Left lower leg: No edema.  Skin:    General: Skin is warm and dry.     Findings: No rash.  Neurological:     Mental Status: He is alert.  Psychiatric:        Mood and Affect: Mood normal.      Musculoskeletal Exam:  Neck full ROM no tenderness Shoulders full ROM no tenderness or swelling Elbows full ROM no tenderness or swelling Wrists full ROM no tenderness or swelling Fingers full ROM no tenderness or swelling Knees rated range of motion and mild tenderness present with no palpable joint effusion, patellofemoral crepitus present Ankles full ROM no tenderness or swelling MTPs full ROM no tenderness  or swelling   CDAI Exam: CDAI Score: 4  Patient Global: 10 mm; Provider Global: 30 mm Swollen: 0 ; Tender: 0  Joint Exam 01/24/2022   All documented joints were normal     Investigation: No additional findings.  Imaging: No results found.  Recent Labs: Lab Results  Component Value Date   WBC 6.3 08/29/2022   HGB 14.7 08/29/2022   PLT 191 08/29/2022   NA 140 08/29/2022   K 4.4 08/29/2022   CL 102 08/29/2022  CO2 28 08/29/2022   GLUCOSE 92 08/29/2022   BUN 24 08/29/2022   CREATININE 1.33 (H) 08/29/2022   BILITOT 0.4 08/29/2022   ALKPHOS 82 04/04/2021   AST 41 (H) 08/29/2022   ALT 54 (H) 08/29/2022   PROT 7.1 08/29/2022   ALBUMIN 4.5 04/04/2021   CALCIUM 9.6 08/29/2022   GFRAA 81 05/13/2016   QFTBGOLDPLUS NEGATIVE 04/27/2021    Speciality Comments: No specialty comments available.  Procedures:  No procedures performed Allergies: Patient has no known allergies.   Assessment / Plan:     Visit Diagnoses: Seronegative arthritis - Plan: Sedimentation rate  Chronic inflammation appearing consistent for RA rather than gout which did not improve despite excellent uric acid control.  Checking sedimentation rate for disease activity monitoring.  Plan to continue the methotrexate 15 mg p.o. weekly folic acid 1 mg daily.  Polyarticular gout  Appears stable without any definite inflammatory episodes.  Recommend he do continue the allopurinol 300 mg for maintaining low uric acid level.  High risk medication use - Plan: CBC with Differential/Platelet, COMPLETE METABOLIC PANEL WITH GFR  Aching CBC and CMP for methotrexate medication monitoring.  Solitary kidney, acquired  Close monitoring for renal function.  Orders: Orders Placed This Encounter  Procedures   CBC with Differential/Platelet   COMPLETE METABOLIC PANEL WITH GFR   Sedimentation rate   No orders of the defined types were placed in this encounter.    Follow-Up Instructions: Return in about 3  months (around 04/23/2022) for RA/Gout on MTX/allopurinol f/u 109mos.   Collier Salina, MD  Note - This record has been created using [employer].  Chart creation errors have been sought, but may not always  have been located. Such creation errors do not reflect on  the standard of medical care.

## 2022-01-24 ENCOUNTER — Other Ambulatory Visit: Payer: Self-pay

## 2022-01-24 ENCOUNTER — Ambulatory Visit (INDEPENDENT_AMBULATORY_CARE_PROVIDER_SITE_OTHER): Payer: 59 | Admitting: Internal Medicine

## 2022-01-24 ENCOUNTER — Encounter: Payer: Self-pay | Admitting: Internal Medicine

## 2022-01-24 VITALS — BP 128/87 | HR 97 | Ht 72.0 in | Wt 225.0 lb

## 2022-01-24 DIAGNOSIS — Z79899 Other long term (current) drug therapy: Secondary | ICD-10-CM

## 2022-01-24 DIAGNOSIS — Z905 Acquired absence of kidney: Secondary | ICD-10-CM | POA: Diagnosis not present

## 2022-01-24 DIAGNOSIS — M109 Gout, unspecified: Secondary | ICD-10-CM

## 2022-01-24 DIAGNOSIS — M1380 Other specified arthritis, unspecified site: Secondary | ICD-10-CM | POA: Diagnosis not present

## 2022-01-24 LAB — CBC WITH DIFFERENTIAL/PLATELET
Absolute Monocytes: 599 cells/uL (ref 200–950)
Basophils Absolute: 50 cells/uL (ref 0–200)
Basophils Relative: 0.8 %
Eosinophils Absolute: 208 cells/uL (ref 15–500)
Eosinophils Relative: 3.3 %
HCT: 42.7 % (ref 38.5–50.0)
Hemoglobin: 14.6 g/dL (ref 13.2–17.1)
Lymphs Abs: 1953 cells/uL (ref 850–3900)
MCH: 31.1 pg (ref 27.0–33.0)
MCHC: 34.2 g/dL (ref 32.0–36.0)
MCV: 90.9 fL (ref 80.0–100.0)
MPV: 10.6 fL (ref 7.5–12.5)
Monocytes Relative: 9.5 %
Neutro Abs: 3490 cells/uL (ref 1500–7800)
Neutrophils Relative %: 55.4 %
Platelets: 223 10*3/uL (ref 140–400)
RBC: 4.7 10*6/uL (ref 4.20–5.80)
RDW: 12.8 % (ref 11.0–15.0)
Total Lymphocyte: 31 %
WBC: 6.3 10*3/uL (ref 3.8–10.8)

## 2022-01-24 LAB — COMPLETE METABOLIC PANEL WITH GFR
AG Ratio: 2 (calc) (ref 1.0–2.5)
ALT: 35 U/L (ref 9–46)
AST: 26 U/L (ref 10–40)
Albumin: 4.6 g/dL (ref 3.6–5.1)
Alkaline phosphatase (APISO): 82 U/L (ref 36–130)
BUN: 14 mg/dL (ref 7–25)
CO2: 30 mmol/L (ref 20–32)
Calcium: 9.7 mg/dL (ref 8.6–10.3)
Chloride: 102 mmol/L (ref 98–110)
Creat: 1.25 mg/dL (ref 0.60–1.29)
Globulin: 2.3 g/dL (calc) (ref 1.9–3.7)
Glucose, Bld: 81 mg/dL (ref 65–99)
Potassium: 4.8 mmol/L (ref 3.5–5.3)
Sodium: 141 mmol/L (ref 135–146)
Total Bilirubin: 0.3 mg/dL (ref 0.2–1.2)
Total Protein: 6.9 g/dL (ref 6.1–8.1)
eGFR: 71 mL/min/{1.73_m2} (ref >=60)

## 2022-01-24 LAB — SEDIMENTATION RATE: Sed Rate: 6 mm/h (ref 0–15)

## 2022-01-25 NOTE — Progress Notes (Signed)
Lab results look fine for continuing methotrexate. His kidney function numbers are actually improved back in normal range. Sedimentation rate test is down to normal as well, indicating less inflammation. No change in plan needed.

## 2022-01-31 ENCOUNTER — Other Ambulatory Visit: Payer: Self-pay | Admitting: Internal Medicine

## 2022-01-31 NOTE — Telephone Encounter (Signed)
Patient called the office requesting a refill of Methotrexate 2.5mg  to be sent to [facility] on [address].

## 2022-01-31 NOTE — Telephone Encounter (Signed)
Next Visit: 04/24/2022  Last Visit: 01/24/2022  Last Fill: 12/12/2021  DX: seronegative inflammatory arthritis   Current Dose per office note 01/24/2022:   Labs: 01/24/2022 Lab results look fine for continuing methotrexate. His kidney function numbers are actually improved back in normal range  Okay to refill MTX?

## 2022-02-03 ENCOUNTER — Telehealth: Payer: Self-pay

## 2022-02-03 MED ORDER — METHOTREXATE 2.5 MG PO TABS: 15.0000 mg | ORAL_TABLET | ORAL | 0 refills | Status: DC

## 2022-02-03 NOTE — Telephone Encounter (Signed)
Patient called to check on his Methotrexate medication.  Patient was informed a 90 day supply was sent to [facility] this morning, 02/03/22.

## 2022-04-23 NOTE — Progress Notes (Signed)
? ?Office Visit Note ? ?Patient: Elijah King             ?Date of Birth: 1971-05-18           ?MRN: [id number]             ?PCP: [name], PA ?Referring: [name], PA ?Visit Date: 04/24/2022 ? ? ?Subjective:  ?Rheumatoid Arthritis (Doing good) ? ? ?History of Present Illness: Elijah King is a 51 y.o. male here for follow up for seronegative arthritis on MTX 15 mg PO weekly and gout on allopurinol 300 mg daily. Since our last visit he has no major flare up of arthritis symptoms. He does notice daily morning stiffness and also joint stiffness worse again after he sits in a fixed position for very long. This improves once up and moving. Knees are the worst symptoms particularly on left. ?  ?Previous HPI ?04/19/21 ?Elijah King is a 51 y.o. male with a history of polyarticular gout since decades ago on allopurinol 600 mg per day here with acute flare of pain since 2 days ago. Symptoms started at left lateral ankle and foot with subsequent expansion to involve left foot toes and whole ankle. Now also feeling right knee pain but much less than the left side symptoms. His last uric acid level was 3.2 in February and he denies missing or changing his medication since then. He is not currently on any colchicine. ? ? ?Review of Systems  ?Constitutional:  Negative for fatigue.  ?HENT:  Negative for mouth dryness.   ?Eyes:  Negative for dryness.  ?Respiratory:  Negative for shortness of breath.   ?Cardiovascular:  Negative for swelling in legs/feet.  ?Gastrointestinal:  Negative for constipation.  ?Endocrine: Negative for increased urination.  ?Genitourinary:  Negative for difficulty urinating.  ?Musculoskeletal:  Positive for joint pain, joint pain and morning stiffness.  ?Skin:  Negative for rash.  ?Allergic/Immunologic: Negative for susceptible to infections.  ?Neurological:  Negative for numbness.  ?Hematological:  Negative for bruising/bleeding tendency.  ?Psychiatric/Behavioral:  Negative for sleep  disturbance.   ? ?PMFS History:  ?Patient Active Problem List  ? Diagnosis Date Noted  ? High risk medication use 04/27/2021  ? Seronegative arthritis 04/27/2021  ? Genetic testing 04/06/2021  ? Family history of breast cancer 03/31/2021  ? Family history of ovarian cancer 03/31/2021  ? Family history of genetic disease carrier   ? Elbow swelling, left 03/03/2021  ? Pes anserine bursitis 07/12/2020  ? Elevated blood pressure reading 03/28/2017  ? Snoring 03/28/2017  ? Solitary kidney, acquired 03/28/2017  ? Chronic kidney disease 03/28/2017  ? Former smoker 03/28/2017  ? Obesity (BMI 30-39.9) 03/28/2017  ? Gastroesophageal reflux disease 03/28/2017  ? 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  ?  ?Past Medical History:  ?Diagnosis Date  ? Chronic kidney disease   ? only 1 kidney  ? Family history of genetic disease carrier   ? GERD (gastroesophageal reflux disease)   ? Gout   ? RA (rheumatoid arthritis) ([place])   ?  ?Family History  ?Problem Relation Age of Onset  ? Anuerysm Father   ? Lung cancer Father   ? Breast cancer Maternal Aunt   ? Ovarian cancer Maternal Aunt   ? Breast cancer Other   ?     MGMs mother dx in her 76s  ? Colon cancer Neg Hx   ? Prostate cancer Neg Hx   ? Colon polyps Neg Hx   ? Esophageal cancer Neg Hx   ?  Rectal cancer Neg Hx   ? Stomach cancer Neg Hx   ? ?Past Surgical History:  ?Procedure Laterality Date  ? KIDNEY CYST REMOVAL Left 1983  ? NEPHRECTOMY Left 1983  ? ?Social History  ? ?Social History Narrative  ? Has a desk job  ? Live in [place]  ? Married, 1 child (son)  ? ?Immunization History  ?Administered Date(s) Administered  ? Influenza Split 01/02/2013  ? Influenza,inj,Quad PF,6+ Mos 09/13/2018  ? Influenza-Unspecified 09/24/2019  ? Tdap 03/28/2017  ?  ? ?Objective: ?Vital Signs: BP (!) 150/89 (BP Location: Left Arm, Patient Position: Sitting, Cuff Size: Normal)   Pulse 99   Resp 15   Ht 6' (1.829 m)   Wt 226 lb (102.5 kg)   BMI 30.65 kg/m?   ? ?Physical Exam ?Cardiovascular:  ?   Rate and  Rhythm: Normal rate and regular rhythm.  ?Pulmonary:  ?   Effort: Pulmonary effort is normal.  ?   Breath sounds: Normal breath sounds.  ?Musculoskeletal:  ?   Right lower leg: No edema.  ?   Left lower leg: No edema.  ?Skin: ?   General: Skin is warm and dry.  ?   Findings: No rash.  ?Neurological:  ?   Mental Status: He is alert.  ?Psychiatric:     ?   Mood and Affect: Mood normal.  ?  ? ?Musculoskeletal Exam:  ?Shoulders full ROM no tenderness or swelling ?Elbows full ROM no tenderness or swelling ?Wrists full ROM no tenderness, right wrist with large cyst at extensor carpi radialis tendon ?Fingers full ROM no tenderness or swelling ?Knees full ROM no swelling, crepitus present bilaterally ?Ankles full ROM no tenderness or swelling ?MTPs full ROM no tenderness or swelling ? ? ?CDAI Exam: ?CDAI Score: 5  ?Patient Global: 20 mm; Provider Global: 20 mm ?Swollen: 0 ; Tender: 1  ?Joint Exam 04/24/2022  ? ?   Right  Left  ?Knee      Tender  ? ? ? ?Investigation: ?No additional findings. ? ?Imaging: ?No results found. ? ?Recent Labs: ?Lab Results  ?Component Value Date  ? WBC 7.2 04/24/2022  ? HGB 14.8 04/24/2022  ? PLT 205 04/24/2022  ? NA 139 04/24/2022  ? K 4.5 04/24/2022  ? CL 104 04/24/2022  ? CO2 28 04/24/2022  ? GLUCOSE 88 04/24/2022  ? BUN 19 04/24/2022  ? CREATININE 1.19 04/24/2022  ? BILITOT 0.3 04/24/2022  ? ALKPHOS 82 04/04/2021  ? AST 28 04/24/2022  ? ALT 40 04/24/2022  ? PROT 7.0 04/24/2022  ? ALBUMIN 4.5 04/04/2021  ? CALCIUM 9.6 04/24/2022  ? GFRAA 81 05/13/2016  ? QFTBGOLDPLUS NEGATIVE 04/27/2021  ? ? ?Speciality Comments: No specialty comments available. ? ?Procedures:  ?No procedures performed ?Allergies: Patient has no known allergies.  ? ?Assessment / Plan:     ?Visit Diagnoses: Seronegative arthritis - Plan: Sedimentation rate ? ?Doing well now no more flare ups after last prednisone taper. Still has mild daily symptoms but low disease activity. Checking sed rate for disease activity assessment.  Assuming normal lab results again today plan to titrate MTX to 20 mg PO weekly and continue folic acid 1 mg daily to see if this improve morning and rest stiffness and some ongoing pain. ? ?Polyarticular gout ? ?Symptoms appear well controlled and uric acid was well in goal range on current dose. Recommend continued allopurinol 300 mg daily. ? ?High risk medication use - Plan: CBC with Differential/Platelet, COMPLETE METABOLIC PANEL WITH GFR ? ?  Checking CBC and CMP for methotrexate toxicity monitoring. He has no intolerance symptoms. ? ?Solitary kidney, acquired ? ?No abnormal renal function at most recent labs, rechecking today for monitoring. ? ?Orders: ?Orders Placed This Encounter  ?Procedures  ? Sedimentation rate  ? CBC with Differential/Platelet  ? COMPLETE METABOLIC PANEL WITH GFR  ? ?No orders of the defined types were placed in this encounter. ? ? ? ?Follow-Up Instructions: Return in about 4 months (around 08/25/2022) for RA/Gout on MTX/allopurinol f/u 67mo. ? ? ?CCollier Salina MD ? ?Note - This record has been created using [employer]  ?Chart creation errors have been sought, but may not always  ?have been located. Such creation errors do not reflect on  ?the standard of medical care. ? ?

## 2022-04-24 ENCOUNTER — Encounter: Payer: Self-pay | Admitting: Internal Medicine

## 2022-04-24 ENCOUNTER — Ambulatory Visit (INDEPENDENT_AMBULATORY_CARE_PROVIDER_SITE_OTHER): Payer: 59 | Admitting: Internal Medicine

## 2022-04-24 VITALS — BP 150/89 | HR 99 | Resp 15 | Ht 72.0 in | Wt 226.0 lb

## 2022-04-24 DIAGNOSIS — M1380 Other specified arthritis, unspecified site: Secondary | ICD-10-CM

## 2022-04-24 DIAGNOSIS — Z905 Acquired absence of kidney: Secondary | ICD-10-CM | POA: Diagnosis not present

## 2022-04-24 DIAGNOSIS — Z79899 Other long term (current) drug therapy: Secondary | ICD-10-CM

## 2022-04-24 DIAGNOSIS — M109 Gout, unspecified: Secondary | ICD-10-CM | POA: Diagnosis not present

## 2022-04-25 ENCOUNTER — Other Ambulatory Visit: Payer: Self-pay | Admitting: *Deleted

## 2022-04-25 LAB — CBC WITH DIFFERENTIAL/PLATELET
Absolute Monocytes: 634 cells/uL (ref 200–950)
Basophils Absolute: 50 cells/uL (ref 0–200)
Basophils Relative: 0.7 %
Eosinophils Absolute: 259 cells/uL (ref 15–500)
Eosinophils Relative: 3.6 %
HCT: 43 % (ref 38.5–50.0)
Hemoglobin: 14.8 g/dL (ref 13.2–17.1)
Lymphs Abs: 1786 cells/uL (ref 850–3900)
MCH: 32 pg (ref 27.0–33.0)
MCHC: 34.4 g/dL (ref 32.0–36.0)
MCV: 93.1 fL (ref 80.0–100.0)
MPV: 10.6 fL (ref 7.5–12.5)
Monocytes Relative: 8.8 %
Neutro Abs: 4471 cells/uL (ref 1500–7800)
Neutrophils Relative %: 62.1 %
Platelets: 205 10*3/uL (ref 140–400)
RBC: 4.62 10*6/uL (ref 4.20–5.80)
RDW: 13.9 % (ref 11.0–15.0)
Total Lymphocyte: 24.8 %
WBC: 7.2 10*3/uL (ref 3.8–10.8)

## 2022-04-25 LAB — COMPLETE METABOLIC PANEL WITH GFR
AG Ratio: 1.9 (calc) (ref 1.0–2.5)
ALT: 40 U/L (ref 9–46)
AST: 28 U/L (ref 10–40)
Albumin: 4.6 g/dL (ref 3.6–5.1)
Alkaline phosphatase (APISO): 75 U/L (ref 36–130)
BUN: 19 mg/dL (ref 7–25)
CO2: 28 mmol/L (ref 20–32)
Calcium: 9.6 mg/dL (ref 8.6–10.3)
Chloride: 104 mmol/L (ref 98–110)
Creat: 1.19 mg/dL (ref 0.60–1.29)
Globulin: 2.4 g/dL (calc) (ref 1.9–3.7)
Glucose, Bld: 88 mg/dL (ref 65–99)
Potassium: 4.5 mmol/L (ref 3.5–5.3)
Sodium: 139 mmol/L (ref 135–146)
Total Bilirubin: 0.3 mg/dL (ref 0.2–1.2)
Total Protein: 7 g/dL (ref 6.1–8.1)
eGFR: 75 mL/min/{1.73_m2} (ref >=60)

## 2022-04-25 LAB — SEDIMENTATION RATE: Sed Rate: 6 mm/h (ref 0–15)

## 2022-04-25 MED ORDER — METHOTREXATE 2.5 MG PO TABS: 20.0000 mg | ORAL_TABLET | ORAL | 2 refills | Status: DC

## 2022-04-25 NOTE — Telephone Encounter (Signed)
-----   Message from Collier Salina, MD sent at 04/25/2022  9:34 AM EDT ----- ?Lab results all look great. I recommend he try the increase in methotrexate to 8 tablets like we discussed. ?

## 2022-04-25 NOTE — Progress Notes (Signed)
Lab results all look great. I recommend he try the increase in methotrexate to 8 tablets like we discussed.

## 2022-07-21 ENCOUNTER — Other Ambulatory Visit: Payer: Self-pay | Admitting: Internal Medicine

## 2022-07-21 NOTE — Telephone Encounter (Signed)
Next Visit: 08/29/2022  Last Visit: 04/24/2022  Last Fill: 04/25/2022  DX: Seronegative arthritis   Current Dose per lab note 04/24/2022:  I recommend he try the increase in methotrexate to 8 tablets like we discussed.  Labs: 04/24/2022 Lab results all look great.    Okay to refill MTX?

## 2022-08-21 NOTE — Progress Notes (Signed)
Office Visit Note  Patient: Elijah King             Date of Birth: 10-12-1971           MRN: [id number]             PCP: [name], PA Referring: [name], PA Visit Date: 08/29/2022   Subjective:  Follow-up (Doing good)   History of Present Illness: Elijah King is a 51 y.o. male here for follow up for seronegative rheumatoid arthritis and gout on methotrexate 20 mg p.o. weekly and allopurinol 809 mg daily and folic acid 1 mg daily.  Overall symptoms are doing very well on his current regimen.  He is experiencing intermittent increases at foot and ankle pain but these are never as severe as the previous gout flares.  He does continue to have pain and stiffness in his left knee gets worse anytime he is stationary seated for more than a few minutes at a time loosens up once he is walking.  Previous HPI 04/24/2022 Elijah King is a 51 y.o. male here for follow up for seronegative arthritis on MTX 15 mg PO weekly and gout on allopurinol 300 mg daily. Since our last visit he has no major flare up of arthritis symptoms. He does notice daily morning stiffness and also joint stiffness worse again after he sits in a fixed position for very long. This improves once up and moving. Knees are the worst symptoms particularly on left.   Previous HPI 04/19/21 Elijah King is a 51 y.o. male with a history of polyarticular gout since decades ago on allopurinol 600 mg per day here with acute flare of pain since 2 days ago. Symptoms started at left lateral ankle and foot with subsequent expansion to involve left foot toes and whole ankle. Now also feeling right knee pain but much less than the left side symptoms. His last uric acid level was 3.2 in February and he denies missing or changing his medication since then. He is not currently on any colchicine.     Review of Systems  Constitutional:  Negative for fatigue.  HENT:  Negative for mouth sores and mouth dryness.   Eyes:  Negative for  dryness.  Respiratory:  Negative for shortness of breath.   Cardiovascular:  Negative for chest pain and palpitations.  Gastrointestinal:  Negative for blood in stool, constipation and diarrhea.  Endocrine: Negative for increased urination.  Genitourinary:  Negative for involuntary urination.  Musculoskeletal:  Positive for joint pain, joint pain and morning stiffness. Negative for gait problem, joint swelling, myalgias, muscle weakness, muscle tenderness and myalgias.  Skin:  Negative for color change, rash, hair loss and sensitivity to sunlight.  Allergic/Immunologic: Negative for susceptible to infections.  Neurological:  Negative for dizziness and headaches.  Hematological:  Negative for swollen glands.  Psychiatric/Behavioral:  Negative for depressed mood and sleep disturbance. The patient is not nervous/anxious.     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   Genetic testing 04/06/2021   Family history of breast cancer 03/31/2021   Family history of ovarian cancer 03/31/2021   Family history of genetic disease carrier    Elbow swelling, left 03/03/2021   Pes anserine bursitis 07/12/2020   Elevated blood pressure reading 03/28/2017   Snoring 03/28/2017   Solitary kidney, acquired 03/28/2017   Chronic kidney disease 03/28/2017   Former smoker  03/28/2017   Obesity (BMI 30-39.9) 03/28/2017   Gastroesophageal reflux disease 03/28/2017   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    Past Medical History:  Diagnosis Date   Chronic kidney disease    only 1 kidney   Family history of genetic disease carrier    GERD (gastroesophageal reflux disease)    Gout    RA (rheumatoid arthritis) ([place])     Family History  Problem Relation Age of Onset   Anuerysm Father    Lung cancer Father    Breast cancer Maternal Aunt    Ovarian cancer Maternal Aunt    Breast cancer Other        MGMs mother dx  in her 89s   Colon cancer Neg Hx    Prostate cancer Neg Hx    Colon polyps Neg Hx    Esophageal cancer Neg Hx    Rectal cancer Neg Hx    Stomach cancer Neg Hx    Past Surgical History:  Procedure Laterality Date   KIDNEY CYST REMOVAL Left 1983   NEPHRECTOMY Left 1983   Social History   Social History Narrative   Has a desk job   Live in [place]   Married, 1 child (son)   Immunization History  Administered Date(s) Administered   Influenza Split 01/02/2013   Influenza,inj,Quad PF,6+ Mos 09/13/2018   Influenza-Unspecified 09/24/2019   Tdap 03/28/2017     Objective: Vital Signs: BP (!) 134/90 (BP Location: Left Arm, Patient Position: Sitting, Cuff Size: Normal)   Pulse 90   Resp 15   Ht '6\' 1"'$  (1.854 m)   Wt 225 lb (102.1 kg)   BMI 29.69 kg/m    Physical Exam Cardiovascular:     Rate and Rhythm: Normal rate and regular rhythm.  Pulmonary:     Effort: Pulmonary effort is normal.     Breath sounds: Normal breath sounds.  Musculoskeletal:     Right lower leg: No edema.     Left lower leg: No edema.  Skin:    General: Skin is warm and dry.     Findings: No rash.  Neurological:     Mental Status: He is alert.  Psychiatric:        Mood and Affect: Mood normal.      Musculoskeletal Exam:  Shoulders full ROM no tenderness or swelling Elbows full ROM no tenderness or swelling Wrists full ROM no tenderness or swelling Fingers full ROM no tenderness or swelling Knees full ROM bilateral patellofemoral crepitus present, left knee joint line tenderness to palpation with no palpable effusion Ankles full ROM no tenderness or swelling MTPs full ROM no tenderness or swelling   CDAI Exam: CDAI Score: -- Patient Global: --; Provider Global: -- Swollen: --; Tender: -- Joint Exam 08/29/2022   No joint exam has been documented for this visit   There is currently no information documented on the homunculus. Go to the Rheumatology activity and complete the homunculus joint  exam.  Investigation: No additional findings.  Imaging: No results found.  Recent Labs: Lab Results  Component Value Date   WBC 6.3 08/29/2022   HGB 14.7 08/29/2022   PLT 191 08/29/2022   NA 140 08/29/2022   K 4.4 08/29/2022   CL 102 08/29/2022   CO2 28 08/29/2022   GLUCOSE 92 08/29/2022   BUN 24 08/29/2022   CREATININE 1.33 (H) 08/29/2022   BILITOT 0.4 08/29/2022   ALKPHOS 82 04/04/2021   AST 41 (H) 08/29/2022   ALT 54 (H) 08/29/2022  PROT 7.1 08/29/2022   ALBUMIN 4.5 04/04/2021   CALCIUM 9.6 08/29/2022   GFRAA 81 05/13/2016   QFTBGOLDPLUS NEGATIVE 04/27/2021    Speciality Comments: No specialty comments available.  Procedures:  No procedures performed Allergies: Patient has no known allergies.   Assessment / Plan:     Visit Diagnoses: Seronegative arthritis - Plan: Sedimentation rate  Joint inflammation appears well controlled on the current methotrexate 20 mg p.o. weekly and folic acid 1 mg daily.  Checking sedimentation rate for disease activity monitoring.  I suspect left knee pain may be more related to underlying structural arthritis problems there is no warmth or effusion on exam.  High risk medication use - MTX to 20 mg PO weekly  - Plan: CBC with Differential/Platelet, COMPLETE METABOLIC PANEL WITH GFR  Checking CBC and CMP for methotrexate toxicity monitoring.  Polyarticular gout - Plan: Uric acid  No new flares he is experiencing mild pain and stiffness around the foot and ankle I am not sure how much is due to gout or could be related to other arthritis or abnormal gait from his knee pain.  Checking uric acid level.  Plan to continue allopurinol 300 mg daily.  Solitary kidney, acquired  Renal function has remained consistently normal checking metabolic panel again today.  Avoiding NSAID exposure.  Orders: Orders Placed This Encounter  Procedures   Sedimentation rate   Uric acid   CBC with Differential/Platelet   COMPLETE METABOLIC PANEL WITH  GFR   No orders of the defined types were placed in this encounter.    Follow-Up Instructions: Return in about 3 months (around 11/28/2022) for RA/gout on MTX/allopurinol f/u 78mo.   CCollier Salina MD  Note - This record has been created using [employer]  Chart creation errors have been sought, but may not always  have been located. Such creation errors do not reflect on  the standard of medical care.

## 2022-08-29 ENCOUNTER — Ambulatory Visit: Payer: 59 | Attending: Internal Medicine | Admitting: Internal Medicine

## 2022-08-29 ENCOUNTER — Encounter: Payer: Self-pay | Admitting: Internal Medicine

## 2022-08-29 VITALS — BP 134/90 | HR 90 | Resp 15 | Ht 73.0 in | Wt 225.0 lb

## 2022-08-29 DIAGNOSIS — Z905 Acquired absence of kidney: Secondary | ICD-10-CM | POA: Diagnosis not present

## 2022-08-29 DIAGNOSIS — M1380 Other specified arthritis, unspecified site: Secondary | ICD-10-CM

## 2022-08-29 DIAGNOSIS — Z79899 Other long term (current) drug therapy: Secondary | ICD-10-CM

## 2022-08-29 DIAGNOSIS — M109 Gout, unspecified: Secondary | ICD-10-CM | POA: Diagnosis not present

## 2022-08-30 LAB — COMPLETE METABOLIC PANEL WITH GFR
AG Ratio: 2 (calc) (ref 1.0–2.5)
ALT: 54 U/L — ABNORMAL HIGH (ref 9–46)
AST: 41 U/L — ABNORMAL HIGH (ref 10–40)
Albumin: 4.7 g/dL (ref 3.6–5.1)
Alkaline phosphatase (APISO): 85 U/L (ref 36–130)
BUN/Creatinine Ratio: 18 (calc) (ref 6–22)
BUN: 24 mg/dL (ref 7–25)
CO2: 28 mmol/L (ref 20–32)
Calcium: 9.6 mg/dL (ref 8.6–10.3)
Chloride: 102 mmol/L (ref 98–110)
Creat: 1.33 mg/dL — ABNORMAL HIGH (ref 0.60–1.29)
Globulin: 2.4 g/dL (calc) (ref 1.9–3.7)
Glucose, Bld: 92 mg/dL (ref 65–99)
Potassium: 4.4 mmol/L (ref 3.5–5.3)
Sodium: 140 mmol/L (ref 135–146)
Total Bilirubin: 0.4 mg/dL (ref 0.2–1.2)
Total Protein: 7.1 g/dL (ref 6.1–8.1)
eGFR: 66 mL/min/{1.73_m2} (ref >=60)

## 2022-08-30 LAB — CBC WITH DIFFERENTIAL/PLATELET
Absolute Monocytes: 542 cells/uL (ref 200–950)
Basophils Absolute: 38 cells/uL (ref 0–200)
Basophils Relative: 0.6 %
Eosinophils Absolute: 239 cells/uL (ref 15–500)
Eosinophils Relative: 3.8 %
HCT: 42.8 % (ref 38.5–50.0)
Hemoglobin: 14.7 g/dL (ref 13.2–17.1)
Lymphs Abs: 1562 cells/uL (ref 850–3900)
MCH: 32.7 pg (ref 27.0–33.0)
MCHC: 34.3 g/dL (ref 32.0–36.0)
MCV: 95.3 fL (ref 80.0–100.0)
MPV: 10.4 fL (ref 7.5–12.5)
Monocytes Relative: 8.6 %
Neutro Abs: 3919 cells/uL (ref 1500–7800)
Neutrophils Relative %: 62.2 %
Platelets: 191 10*3/uL (ref 140–400)
RBC: 4.49 10*6/uL (ref 4.20–5.80)
RDW: 13.2 % (ref 11.0–15.0)
Total Lymphocyte: 24.8 %
WBC: 6.3 10*3/uL (ref 3.8–10.8)

## 2022-08-30 LAB — SEDIMENTATION RATE: Sed Rate: 2 mm/h (ref 0–15)

## 2022-08-30 LAB — URIC ACID: Uric Acid, Serum: 8.4 mg/dL — ABNORMAL HIGH (ref 4.0–8.0)

## 2022-09-01 MED ORDER — FOLIC ACID 1 MG PO TABS: 1.0000 mg | ORAL_TABLET | Freq: Every day | ORAL | 3 refills | Status: DC

## 2022-09-01 MED ORDER — METHOTREXATE 2.5 MG PO TABS: 15.0000 mg | ORAL_TABLET | ORAL | 2 refills | Status: DC

## 2022-09-01 NOTE — Progress Notes (Signed)
I spoke with Elijah King his lab results show a small increase in the serum creatinine and liver function tests.  I recommend he decrease methotrexate dose back down to 15 mg p.o. weekly which was well-tolerated.  He reports taking the Prilosec about every 3 or 4 days.  I do not think this is frequent enough to be significantly affecting medication concentration.  Uric acid was increased at 8.4 he reports good adherence to the allopurinol 300 mg daily we will continue this as he has had no flares but may need dose adjustment if labs remain significantly elevated.

## 2022-09-18 ENCOUNTER — Encounter: Payer: Self-pay | Admitting: *Deleted

## 2022-10-18 ENCOUNTER — Other Ambulatory Visit: Payer: Self-pay | Admitting: *Deleted

## 2022-10-18 NOTE — Telephone Encounter (Signed)
Received a prior authorization for MTX. [place] who states the medication does not need a prior authorization. The insurance will only cover a 30 day supply. Please make sure to only send a 30 day supply to pharmacy.

## 2022-10-19 MED ORDER — METHOTREXATE SODIUM 2.5 MG PO TABS
15.0000 mg | ORAL_TABLET | ORAL | 2 refills | Status: DC
  Filled 2022-11-01: qty 24, 28d supply, fill #0
  Filled 2022-12-04: qty 24, 28d supply, fill #1
  Filled 2023-01-01: qty 24, 28d supply, fill #2

## 2022-11-01 ENCOUNTER — Other Ambulatory Visit (HOSPITAL_BASED_OUTPATIENT_CLINIC_OR_DEPARTMENT_OTHER): Payer: Self-pay

## 2022-11-28 ENCOUNTER — Ambulatory Visit: Payer: 59 | Admitting: Internal Medicine

## 2022-12-04 ENCOUNTER — Other Ambulatory Visit (HOSPITAL_BASED_OUTPATIENT_CLINIC_OR_DEPARTMENT_OTHER): Payer: Self-pay

## 2022-12-07 ENCOUNTER — Encounter: Payer: Self-pay | Admitting: *Deleted

## 2023-01-01 ENCOUNTER — Other Ambulatory Visit (HOSPITAL_BASED_OUTPATIENT_CLINIC_OR_DEPARTMENT_OTHER): Payer: Self-pay

## 2023-01-02 ENCOUNTER — Other Ambulatory Visit (HOSPITAL_BASED_OUTPATIENT_CLINIC_OR_DEPARTMENT_OTHER): Payer: Self-pay

## 2023-01-02 ENCOUNTER — Ambulatory Visit (INDEPENDENT_AMBULATORY_CARE_PROVIDER_SITE_OTHER): Payer: 59 | Admitting: Physician Assistant

## 2023-01-02 ENCOUNTER — Encounter: Payer: Self-pay | Admitting: Physician Assistant

## 2023-01-02 VITALS — BP 130/80 | HR 115 | Temp 98.4°F | Ht 73.0 in | Wt 227.0 lb

## 2023-01-02 DIAGNOSIS — J029 Acute pharyngitis, unspecified: Secondary | ICD-10-CM | POA: Diagnosis not present

## 2023-01-02 LAB — POC INFLUENZA A&B (BINAX/QUICKVUE)
Influenza A, POC: NEGATIVE
Influenza B, POC: NEGATIVE

## 2023-01-02 LAB — POCT RAPID STREP A (OFFICE): Rapid Strep A Screen: NEGATIVE

## 2023-01-02 MED ORDER — PREDNISONE 20 MG PO TABS
40.0000 mg | ORAL_TABLET | Freq: Every day | ORAL | 0 refills | Status: DC
  Filled 2023-01-02: qty 10, 5d supply, fill #0

## 2023-01-02 MED ORDER — NYSTATIN 100000 UNIT/ML MT SUSP
5.0000 mL | Freq: Three times a day (TID) | OROMUCOSAL | 0 refills | Status: DC | PRN
  Filled 2023-01-02: qty 180, 12d supply, fill #0

## 2023-01-02 MED ORDER — AMOXICILLIN-POT CLAVULANATE 875-125 MG PO TABS
1.0000 | ORAL_TABLET | Freq: Two times a day (BID) | ORAL | 0 refills | Status: DC
  Filled 2023-01-02: qty 14, 7d supply, fill #0

## 2023-01-02 NOTE — Patient Instructions (Signed)
It was great to see you!  Start prednisone and augmentin antibiotic Mouthwash as needed for throat pain  Follow-up if any concerns  Take care,  [name] PA-C

## 2023-01-02 NOTE — Progress Notes (Signed)
Elijah King is a 52 y.o. male here for a new problem.  History of Present Illness:   Chief Complaint  Patient presents with   Sore Throat    Pt c/o sore throat x 1 week, worsening.    Cough    Slight cough, expectorating brown sputum.    HPI  Sore throat His sore throat has progressively worsened and is now severe. His throat pain is worsened with swallowing. His pain is only relieved with drinking very cold water. He reports intermittent headaches and ear discomfort. Patient notes accompanying cough which is intermittently mildly productive. He denies any known sick contacts. He denies any fevers, neck stiffness, inability to control secretions. He is drinking plenty of water.   Past Medical History:  Diagnosis Date   Chronic kidney disease    only 1 kidney   Family history of genetic disease carrier    GERD (gastroesophageal reflux disease)    Gout    RA (rheumatoid arthritis) ([place])      Social History   Tobacco Use   Smoking status: Former    Packs/day: 0.50    Years: 30.00    Total pack years: 15.00    Types: Cigarettes    Start date: 03/03/1989    Quit date: 12/25/2017    Years since quitting: 5.0    Passive exposure: Current   Smokeless tobacco: Never  Vaping Use   Vaping Use: Some days  Substance Use Topics   Alcohol use: Yes    Comment: occ   Drug use: No    Past Surgical History:  Procedure Laterality Date   KIDNEY CYST REMOVAL Left 1983   NEPHRECTOMY Left 1983    Family History  Problem Relation Age of Onset   Anuerysm Father    Lung cancer Father    Breast cancer Maternal Aunt    Ovarian cancer Maternal Aunt    Breast cancer Other        MGMs mother dx in her 89s   Colon cancer Neg Hx    Prostate cancer Neg Hx    Colon polyps Neg Hx    Esophageal cancer Neg Hx    Rectal cancer Neg Hx    Stomach cancer Neg Hx     No Known Allergies  Current Medications:   Current Outpatient Medications:    allopurinol (ZYLOPRIM) 300 MG tablet,  TAKE 1 TABLET(300 MG) BY MOUTH DAILY, Disp: 90 tablet, Rfl: 3   amoxicillin-clavulanate (AUGMENTIN) 875-125 MG tablet, Take 1 tablet by mouth 2 (two) times daily., Disp: 14 tablet, Rfl: 0   folic acid (FOLVITE) 1 MG tablet, Take 1 tablet (1 mg total) by mouth daily., Disp: 90 tablet, Rfl: 3   magic mouthwash (nystatin, lidocaine, diphenhydrAMINE, alum & mag hydroxide) suspension, Swish and spit 5 mLs 3 (three) times daily as needed for mouth pain., Disp: 180 mL, Rfl: 0   methotrexate (RHEUMATREX) 2.5 MG tablet, Take 6 tablets (15 mg total) by mouth once a week. Caution:Chemotherapy. Protect from light., Disp: 24 tablet, Rfl: 2   omeprazole (PRILOSEC) 20 MG capsule, Take 20 mg by mouth as needed., Disp: , Rfl:    predniSONE (DELTASONE) 20 MG tablet, Take 2 tablets (40 mg total) by mouth daily., Disp: 10 tablet, Rfl: 0   Review of Systems:   Review of Systems  Constitutional:  Negative for fever.  HENT:  Positive for sore throat. Negative for congestion and sinus pain.   Respiratory:  Positive for cough and sputum production. Negative for  shortness of breath and wheezing.   Cardiovascular:  Negative for chest pain and palpitations.  Gastrointestinal:  Negative for blood in stool, constipation, diarrhea, nausea and vomiting.  Genitourinary:  Negative for dysuria, frequency and hematuria.  Musculoskeletal:  Negative for joint pain and myalgias.    Vitals:   Vitals:   01/02/23 1537  BP: 130/80  Pulse: (!) 115  Temp: 98.4 F (36.9 C)  TempSrc: Temporal  SpO2: 96%  Weight: 227 lb (103 kg)  Height: '6\' 1"'$  (1.854 m)     Body mass index is 29.95 kg/m.  Physical Exam:   Physical Exam Vitals and nursing note reviewed.  Constitutional:      General: He is not in acute distress.    Appearance: He is well-developed. He is not ill-appearing or toxic-appearing.  HENT:     Head: Normocephalic and atraumatic.     Right Ear: Tympanic membrane, ear canal and external ear normal. Tympanic  membrane is not erythematous, retracted or bulging.     Left Ear: Tympanic membrane, ear canal and external ear normal. Tympanic membrane is not erythematous, retracted or bulging.     Nose: Nose normal.     Right Sinus: No maxillary sinus tenderness or frontal sinus tenderness.     Left Sinus: No maxillary sinus tenderness or frontal sinus tenderness.     Mouth/Throat:     Pharynx: Uvula midline. Posterior oropharyngeal erythema present.     Tonsils: Tonsillar exudate present. 3+ on the right. 3+ on the left.  Eyes:     General: Lids are normal.     Conjunctiva/sclera: Conjunctivae normal.  Neck:     Trachea: Trachea normal.  Cardiovascular:     Rate and Rhythm: Normal rate and regular rhythm.     Pulses: Normal pulses.     Heart sounds: Normal heart sounds, S1 normal and S2 normal.  Pulmonary:     Effort: Pulmonary effort is normal.     Breath sounds: Normal breath sounds. No decreased breath sounds, wheezing, rhonchi or rales.  Lymphadenopathy:     Cervical: No cervical adenopathy.  Skin:    General: Skin is warm and dry.  Neurological:     Mental Status: He is alert.     GCS: GCS eye subscore is 4. GCS verbal subscore is 5. GCS motor subscore is 6.  Psychiatric:        Speech: Speech normal.        Behavior: Behavior normal. Behavior is cooperative.    Results for orders placed or performed in visit on 01/02/23  POCT rapid strep A  Result Value Ref Range   Rapid Strep A Screen Negative Negative  POC Influenza A&B(BINAX/QUICKVUE)  Result Value Ref Range   Influenza A, POC Negative Negative   Influenza B, POC Negative Negative    Assessment and Plan:   Sore throat Strep and flu test are negative Suspect tonsillitis I have sent in prednisone, augmentin and magic mouthwash If any new/worsening sx, close follow-up -- would likely need ER eval vs CT scan  I,Alexis Herring,acting as a scribe for [employer], PA.,have documented all relevant documentation on the  behalf of [name], PA,as directed by  [name], PA while in the presence of [name], [place].  I, [name], [place], have reviewed all documentation for this visit. The documentation on 01/02/23 for the exam, diagnosis, procedures, and orders are all accurate and complete.  [name], PA-C

## 2023-01-22 ENCOUNTER — Other Ambulatory Visit: Payer: Self-pay | Admitting: Internal Medicine

## 2023-01-22 ENCOUNTER — Other Ambulatory Visit (HOSPITAL_BASED_OUTPATIENT_CLINIC_OR_DEPARTMENT_OTHER): Payer: Self-pay

## 2023-01-23 NOTE — Telephone Encounter (Signed)
Next Visit: due 11/2022, message sent to the front desk to schedule.   Last Visit: 08/29/2022  Last Fill: 10/19/2022  DX: Seronegative arthritis   Current Dose per office note on 08/29/2022: MTX to 20 mg PO weekly   Labs: 08/29/2022 I recommend he decrease methotrexate dose back down to 15 mg p.o. weekly which was well-tolerated.  He reports taking the Prilosec about every 3 or 4 days.  I do not think this is frequent enough to be significantly affecting medication concentration.  Uric acid was increased at 8.4 he reports good adherence to the allopurinol 300 mg daily we will continue this as he has had no flares but may need dose adjustment if labs remain significantly elevated.   Okay to refill MTX?

## 2023-01-24 ENCOUNTER — Telehealth: Payer: Self-pay | Admitting: Internal Medicine

## 2023-01-24 ENCOUNTER — Other Ambulatory Visit (HOSPITAL_BASED_OUTPATIENT_CLINIC_OR_DEPARTMENT_OTHER): Payer: Self-pay

## 2023-01-24 MED ORDER — METHOTREXATE SODIUM 2.5 MG PO TABS
15.0000 mg | ORAL_TABLET | ORAL | 0 refills | Status: DC
  Filled 2023-01-24: qty 30, 35d supply, fill #0

## 2023-01-24 NOTE — Telephone Encounter (Signed)
-----  Message from [place] sent at 01/23/2023  8:41 AM EST ----- Patient was due for a follow up in 11/2022. Please call to schedule. Thanks!

## 2023-01-24 NOTE — Telephone Encounter (Signed)
LMOM for patient to call and schedule follow-up appointment.   °

## 2023-01-27 ENCOUNTER — Other Ambulatory Visit (HOSPITAL_BASED_OUTPATIENT_CLINIC_OR_DEPARTMENT_OTHER): Payer: Self-pay

## 2023-01-28 NOTE — Progress Notes (Signed)
Office Visit Note  Patient: Elijah Elijah             Date of Birth: 07/26/1971           MRN: [id number]             PCP: [name], PA Referring: [name], PA Visit Date: 01/29/2023   Subjective:  Follow-up   History of Present Illness: Elijah Elijah is a 52 y.o. male here for follow up for seronegative rheumatoid arthritis and gout on methotrexate 20 mg p.o. weekly and allopurinol [id number] mg daily and folic acid 1 mg daily.  Overall symptoms have been doing pretty well still gets a little bit of pain and stiffness in his knee most commonly after increased activity such as playing sports and running with his 51 year old.  Foot and ankle have not been a significant problem.  He had 1 episode of a very painful sore throat with some kind of illness this was treated with a course of Magic mouthwash and prednisone and Augmentin and has completely resolved.   Previous HPI 08/29/22 Elijah Elijah is a 52 y.o. male here for follow up for seronegative rheumatoid arthritis and gout on methotrexate 20 mg p.o. weekly and allopurinol [id number] mg daily and folic acid 1 mg daily.  Overall symptoms are doing very well on his current regimen.  He is experiencing intermittent increases at foot and ankle pain but these are never as severe as the previous gout flares.  He does continue to have pain and stiffness in his left knee gets worse anytime he is stationary seated for more than a few minutes at a time loosens up once he is walking.   Previous HPI 04/24/2022 Elijah Elijah is a 52 y.o. male here for follow up for seronegative arthritis on MTX 15 mg PO weekly and gout on allopurinol 300 mg daily. Since our last visit he has no major flare up of arthritis symptoms. He does notice daily morning stiffness and also joint stiffness worse again after he sits in a fixed position for very long. This improves once up and moving. Knees are the worst symptoms particularly on left.   Previous HPI 04/19/21 Elijah Elijah is a 52 y.o. male with a history of polyarticular gout since decades ago on allopurinol 600 mg per day here with acute flare of pain since 2 days ago. Symptoms started at left lateral ankle and foot with subsequent expansion to involve left foot toes and whole ankle. Now also feeling right knee pain but much less than the left side symptoms. His last uric acid level was 3.2 in February and he denies missing or changing his medication since then. He is not currently on any colchicine.   Review of Systems  Constitutional:  Negative for fatigue.  HENT:  Negative for mouth sores and mouth dryness.   Eyes:  Negative for dryness.  Respiratory:  Negative for shortness of breath.   Cardiovascular:  Negative for chest pain and palpitations.  Gastrointestinal:  Negative for blood in stool, constipation and diarrhea.  Endocrine: Negative for increased urination.  Genitourinary:  Negative for involuntary urination.  Musculoskeletal:  Positive for joint pain, joint pain and joint swelling. Negative for gait problem, myalgias, muscle weakness, morning stiffness, muscle tenderness and myalgias.  Skin:  Negative for color change, rash, hair loss and sensitivity to sunlight.  Allergic/Immunologic: Negative for susceptible to infections.  Neurological:  Negative for dizziness and headaches.  Hematological:  Negative for  swollen glands.  Psychiatric/Behavioral:  Negative for depressed mood and sleep disturbance. The patient is not nervous/anxious.     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   Genetic testing 04/06/2021   Family history of breast cancer 03/31/2021   Family history of ovarian cancer 03/31/2021   Family history of genetic disease carrier    Elbow swelling, left 03/03/2021   Pes anserine bursitis 07/12/2020   Elevated blood pressure reading 03/28/2017   Snoring 03/28/2017   Solitary  kidney, acquired 03/28/2017   Chronic kidney disease 03/28/2017   Former smoker 03/28/2017   Obesity (BMI 30-39.9) 03/28/2017   Gastroesophageal reflux disease 03/28/2017   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    Past Medical History:  Diagnosis Date   Chronic kidney disease    only 1 kidney   Family history of genetic disease carrier    GERD (gastroesophageal reflux disease)    Gout    RA (rheumatoid arthritis) ([place])     Family History  Problem Relation Age of Onset   Anuerysm Father    Lung cancer Father    Breast cancer Maternal Aunt    Ovarian cancer Maternal Aunt    Breast cancer Other        MGMs mother dx in her 13s   Colon cancer Neg Hx    Prostate cancer Neg Hx    Colon polyps Neg Hx    Esophageal cancer Neg Hx    Rectal cancer Neg Hx    Stomach cancer Neg Hx    Past Surgical History:  Procedure Laterality Date   KIDNEY CYST REMOVAL Left 1983   NEPHRECTOMY Left 1983   Social History   Social History Narrative   Has a desk job   Live in [place]   Married, 1 child (son)   Immunization History  Administered Date(s) Administered   Influenza Split 01/02/2013   Influenza,inj,Quad PF,6+ Mos 09/13/2018   Influenza-Unspecified 09/24/2019   Tdap 03/28/2017     Objective: Vital Signs: BP (!) 145/93 (BP Location: Left Arm, Patient Position: Sitting, Cuff Size: Large)   Pulse 87   Resp 12   Ht 6' (1.829 m)   Wt 225 lb (102.1 kg)   BMI 30.52 kg/m    Physical Exam HENT:     Mouth/Throat:     Mouth: Mucous membranes are moist.     Pharynx: Oropharynx is clear.  Eyes:     Conjunctiva/sclera: Conjunctivae normal.  Cardiovascular:     Rate and Rhythm: Normal rate and regular rhythm.  Pulmonary:     Effort: Pulmonary effort is normal.     Breath sounds: Normal breath sounds.  Musculoskeletal:     Right lower leg: No edema.     Left lower leg: No edema.  Skin:    General: Skin is warm and dry.     Findings: No rash.  Neurological:     Mental Status: He is  alert.  Psychiatric:        Mood and Affect: Mood normal.      Musculoskeletal Exam:  Shoulders full ROM no tenderness or swelling Elbows full ROM no tenderness or swelling Wrists full ROM no tenderness or swelling Fingers full ROM no tenderness or swelling Knees with bilateral patellofemoral crepitus, joint line tenderness to palpation on the left side with no palpable effusion Ankles full ROM no tenderness or swelling MTPs full ROM no tenderness or swelling  CDAI Exam: CDAI Score: -- Patient Global: --; Provider Global: -- Swollen: 0 ; Tender: 1  Joint Exam 01/29/2023      Right  Left  Knee      Tender     Investigation: No additional findings.  Imaging: No results found.  Recent Labs: Lab Results  Component Value Date   WBC 6.4 02/15/2023   HGB 14.1 02/15/2023   PLT 186 02/15/2023   NA 140 02/15/2023   K 4.4 02/15/2023   CL 106 02/15/2023   CO2 26 02/15/2023   GLUCOSE 122 (H) 02/15/2023   BUN 23 02/15/2023   CREATININE 1.23 02/15/2023   BILITOT 0.3 02/15/2023   ALKPHOS 82 04/04/2021   AST 42 (H) 02/15/2023   ALT 84 (H) 02/15/2023   PROT 6.7 02/15/2023   ALBUMIN 4.5 04/04/2021   CALCIUM 9.2 02/15/2023   GFRAA 81 05/13/2016   QFTBGOLDPLUS NEGATIVE 04/27/2021    Speciality Comments: No specialty comments available.  Procedures:  No procedures performed Allergies: Patient has no known allergies.   Assessment / Plan:     Visit Diagnoses: Seronegative arthritis - Plan: methotrexate (RHEUMATREX) 2.5 MG tablet, Sedimentation rate  Disease activity looks well-controlled some continued knee pain may be related to underlying osteoarthritis has more pain with use.  Rechecking sedimentation rate for disease activity monitoring.  Concerned about recent abnormal labs if he is okay with plan to continue methotrexate at current dose 15 mg p.o. weekly with folic acid 1 mg daily.  If more concerning results discussed possible switch to hydroxychloroquine that is  safer for liver and kidney function.  Polyarticular gout  No significant flareups feet and ankles look good today plan to continue allopurinol 300 mg daily.  Chronic kidney disease, unspecified CKD stage  Mildly decreased renal function at baseline with single kidney.  Monitoring metabolic panel and limit nephrotoxic medication exposure.  High risk medication use - Plan: CBC with Differential/Platelet, COMPLETE METABOLIC PANEL WITH GFR  Checking CBC and CMP for medication monitoring continuing on methotrexate and allopurinol.  Had 1 significant interval upper respiratory infection with pharyngitis but is entirely resolved.  Previous mild LFT elevations so we decreased methotrexate dosing but if these remain significantly abnormal we will be changing treatment.  Orders: Orders Placed This Encounter  Procedures   CBC with Differential/Platelet   COMPLETE METABOLIC PANEL WITH GFR   Sedimentation rate   Meds ordered this encounter  Medications   methotrexate (RHEUMATREX) 2.5 MG tablet    Sig: Take 6 tablets (15 mg total) by mouth once a week. Caution:Chemotherapy. Protect from light.    Dispense:  78 tablet    Refill:  0     Follow-Up Instructions: Return in about 3 months (around 04/29/2023) for RA/gout on MTX/allopurinol f/u 19mo.   CCollier Salina MD  Note - This record has been created using [employer]  Chart creation errors have been sought, but may not always  have been located. Such creation errors do not reflect on  the standard of medical care.

## 2023-01-29 ENCOUNTER — Other Ambulatory Visit (HOSPITAL_BASED_OUTPATIENT_CLINIC_OR_DEPARTMENT_OTHER): Payer: Self-pay

## 2023-01-29 ENCOUNTER — Encounter: Payer: Self-pay | Admitting: Internal Medicine

## 2023-01-29 ENCOUNTER — Ambulatory Visit: Payer: 59 | Attending: Internal Medicine | Admitting: Internal Medicine

## 2023-01-29 VITALS — BP 145/93 | HR 87 | Resp 12 | Ht 72.0 in | Wt 225.0 lb

## 2023-01-29 DIAGNOSIS — M109 Gout, unspecified: Secondary | ICD-10-CM

## 2023-01-29 DIAGNOSIS — Z79899 Other long term (current) drug therapy: Secondary | ICD-10-CM | POA: Diagnosis not present

## 2023-01-29 DIAGNOSIS — M1380 Other specified arthritis, unspecified site: Secondary | ICD-10-CM

## 2023-01-29 DIAGNOSIS — N189 Chronic kidney disease, unspecified: Secondary | ICD-10-CM | POA: Diagnosis not present

## 2023-01-29 MED ORDER — METHOTREXATE SODIUM 2.5 MG PO TABS
15.0000 mg | ORAL_TABLET | ORAL | 0 refills | Status: DC
  Filled 2023-01-29: qty 78, 91d supply, fill #0

## 2023-01-30 ENCOUNTER — Telehealth: Payer: Self-pay | Admitting: *Deleted

## 2023-01-30 DIAGNOSIS — Z79899 Other long term (current) drug therapy: Secondary | ICD-10-CM

## 2023-01-30 DIAGNOSIS — M1380 Other specified arthritis, unspecified site: Secondary | ICD-10-CM

## 2023-01-30 LAB — CBC WITH DIFFERENTIAL/PLATELET
Absolute Monocytes: 669 cells/uL (ref 200–950)
Basophils Absolute: 28 cells/uL (ref 0–200)
Basophils Relative: 0.4 %
Eosinophils Absolute: 179 cells/uL (ref 15–500)
Eosinophils Relative: 2.6 %
HCT: 44.6 % (ref 38.5–50.0)
Hemoglobin: 15.4 g/dL (ref 13.2–17.1)
Lymphs Abs: 1415 cells/uL (ref 850–3900)
MCH: 32.5 pg (ref 27.0–33.0)
MCHC: 34.5 g/dL (ref 32.0–36.0)
MCV: 94.1 fL (ref 80.0–100.0)
MPV: 10.5 fL (ref 7.5–12.5)
Monocytes Relative: 9.7 %
Neutro Abs: 4609 cells/uL (ref 1500–7800)
Neutrophils Relative %: 66.8 %
Platelets: 209 10*3/uL (ref 140–400)
RBC: 4.74 10*6/uL (ref 4.20–5.80)
RDW: 12.9 % (ref 11.0–15.0)
Total Lymphocyte: 20.5 %
WBC: 6.9 10*3/uL (ref 3.8–10.8)

## 2023-01-30 LAB — COMPLETE METABOLIC PANEL WITH GFR
AG Ratio: 1.6 (calc) (ref 1.0–2.5)
ALT: 151 U/L — ABNORMAL HIGH (ref 9–46)
AST: 79 U/L — ABNORMAL HIGH (ref 10–35)
Albumin: 4.5 g/dL (ref 3.6–5.1)
Alkaline phosphatase (APISO): 72 U/L (ref 35–144)
BUN/Creatinine Ratio: 16 (calc) (ref 6–22)
BUN: 22 mg/dL (ref 7–25)
CO2: 26 mmol/L (ref 20–32)
Calcium: 9.7 mg/dL (ref 8.6–10.3)
Chloride: 101 mmol/L (ref 98–110)
Creat: 1.38 mg/dL — ABNORMAL HIGH (ref 0.70–1.30)
Globulin: 2.9 g/dL (calc) (ref 1.9–3.7)
Glucose, Bld: 92 mg/dL (ref 65–99)
Potassium: 4.9 mmol/L (ref 3.5–5.3)
Sodium: 140 mmol/L (ref 135–146)
Total Bilirubin: 0.5 mg/dL (ref 0.2–1.2)
Total Protein: 7.4 g/dL (ref 6.1–8.1)
eGFR: 62 mL/min/{1.73_m2} (ref >=60)

## 2023-01-30 LAB — SEDIMENTATION RATE: Sed Rate: 2 mm/h (ref 0–15)

## 2023-01-30 NOTE — Telephone Encounter (Signed)
-----   Message from Collier Salina, MD sent at 01/30/2023  8:22 AM EST ----- Unfortunately liver function tests showed further increase despite reducing the methotrexate dose. I recommend we will need to stop this medication to be safe. I also like to recheck labs after 2 weeks since stopping it to make sure number are coming  back towards normal too. There is an alternative medication called hydroxychloroquine that would be my next best recommendation if we can't continue the methotrexate

## 2023-01-30 NOTE — Progress Notes (Signed)
Unfortunately liver function tests showed further increase despite reducing the methotrexate dose. I recommend we will need to stop this medication to be safe. I also like to recheck labs after 2 weeks since stopping it to make sure number are coming back towards normal too. There is an alternative medication called hydroxychloroquine that would be my next best recommendation if we can't continue the methotrexate

## 2023-02-15 ENCOUNTER — Other Ambulatory Visit: Payer: Self-pay | Admitting: *Deleted

## 2023-02-15 DIAGNOSIS — M1380 Other specified arthritis, unspecified site: Secondary | ICD-10-CM

## 2023-02-15 DIAGNOSIS — Z79899 Other long term (current) drug therapy: Secondary | ICD-10-CM

## 2023-02-16 LAB — COMPLETE METABOLIC PANEL WITH GFR
AG Ratio: 1.9 (calc) (ref 1.0–2.5)
ALT: 84 U/L — ABNORMAL HIGH (ref 9–46)
AST: 42 U/L — ABNORMAL HIGH (ref 10–35)
Albumin: 4.4 g/dL (ref 3.6–5.1)
Alkaline phosphatase (APISO): 77 U/L (ref 35–144)
BUN: 23 mg/dL (ref 7–25)
CO2: 26 mmol/L (ref 20–32)
Calcium: 9.2 mg/dL (ref 8.6–10.3)
Chloride: 106 mmol/L (ref 98–110)
Creat: 1.23 mg/dL (ref 0.70–1.30)
Globulin: 2.3 g/dL (calc) (ref 1.9–3.7)
Glucose, Bld: 122 mg/dL — ABNORMAL HIGH (ref 65–99)
Potassium: 4.4 mmol/L (ref 3.5–5.3)
Sodium: 140 mmol/L (ref 135–146)
Total Bilirubin: 0.3 mg/dL (ref 0.2–1.2)
Total Protein: 6.7 g/dL (ref 6.1–8.1)
eGFR: 72 mL/min/{1.73_m2} (ref >=60)

## 2023-02-16 LAB — CBC WITH DIFFERENTIAL/PLATELET
Absolute Monocytes: 506 cells/uL (ref 200–950)
Basophils Absolute: 38 cells/uL (ref 0–200)
Basophils Relative: 0.6 %
Eosinophils Absolute: 198 cells/uL (ref 15–500)
Eosinophils Relative: 3.1 %
HCT: 41 % (ref 38.5–50.0)
Hemoglobin: 14.1 g/dL (ref 13.2–17.1)
Lymphs Abs: 1536 cells/uL (ref 850–3900)
MCH: 32.2 pg (ref 27.0–33.0)
MCHC: 34.4 g/dL (ref 32.0–36.0)
MCV: 93.6 fL (ref 80.0–100.0)
MPV: 11 fL (ref 7.5–12.5)
Monocytes Relative: 7.9 %
Neutro Abs: 4122 cells/uL (ref 1500–7800)
Neutrophils Relative %: 64.4 %
Platelets: 186 10*3/uL (ref 140–400)
RBC: 4.38 10*6/uL (ref 4.20–5.80)
RDW: 12.8 % (ref 11.0–15.0)
Total Lymphocyte: 24 %
WBC: 6.4 10*3/uL (ref 3.8–10.8)

## 2023-02-16 NOTE — Patient Instructions (Signed)
Hydroxychloroquine Tablets What is this medication? HYDROXYCHLOROQUINE (hye drox ee KLOR oh kwin) treats autoimmune conditions, such as rheumatoid arthritis and lupus. It works by slowing down an overactive immune system. It may also be used to prevent and treat malaria. It works by killing the parasite that causes malaria. It belongs to a group of medications called DMARDs. This medicine may be used for other purposes; ask your health care provider or pharmacist if you have questions. COMMON BRAND NAME(S): Plaquenil, Quineprox What should I tell my care team before I take this medication? They need to know if you have any of these conditions: Diabetes Eye disease, vision problems Frequently drink alcohol G6PD deficiency Heart disease Irregular heartbeat or rhythm Kidney disease Liver disease Porphyria Psoriasis An unusual or allergic reaction to hydroxychloroquine, other medications, foods, dyes, or preservatives Pregnant or trying to get pregnant Breastfeeding How should I use this medication? Take this medication by mouth with water. Take it as directed on the prescription label. Do not cut, crush, or chew this medication. Swallow the tablets whole. Take it with food. Do not take it more than directed. Take all of this medication unless your care team tells you to stop it early. Keep taking it even if you think you are better. Take products with antacids in them at a different time of day than this medication. Take this medication 4 hours before or 4 hours after antacids. Talk to your care team if you have questions. Talk to your care team about the use of this medication in children. While this medication may be prescribed for selected conditions, precautions do apply. Overdosage: If you think you have taken too much of this medicine contact a poison control center or emergency room at once. NOTE: This medicine is only for you. Do not share this medicine with others. What if I miss a  dose? If you miss a dose, take it as soon as you can. If it is almost time for your next dose, take only that dose. Do not take double or extra doses. What may interact with this medication? Do not take this medication with any of the following: Cisapride Dronedarone Pimozide Thioridazine This medication may also interact with the following: Ampicillin Antacids Cimetidine Cyclosporine Digoxin Kaolin Medications for diabetes, such as insulin, glipizide, glyburide Medications for seizures, such as carbamazepine, phenobarbital, phenytoin Mefloquine Methotrexate Other medications that cause heart rhythm changes Praziquantel This list may not describe all possible interactions. Give your health care provider a list of all the medicines, herbs, non-prescription drugs, or dietary supplements you use. Also tell them if you smoke, drink alcohol, or use illegal drugs. Some items may interact with your medicine. What should I watch for while using this medication? Visit your care team for regular checks on your progress. Tell your care team if your symptoms do not start to get better or if they get worse. You may need blood work done while you are taking this medication. If you take other medications that can affect heart rhythm, you may need more testing. Talk to your care team if you have questions. Your vision may be tested before and during use of this medication. Tell your care team right away if you have any change in your eyesight. This medication may cause serious skin reactions. They can happen weeks to months after starting the medication. Contact your care team right away if you notice fevers or flu-like symptoms with a rash. The rash may be red or purple and then turn  into blisters or peeling of the skin. Or, you might notice a red rash with swelling of the face, lips or lymph nodes in your neck or under your arms. If you or your family notice any changes in your behavior, such as new or  worsening depression, thoughts of harming yourself, anxiety, or other unusual or disturbing thoughts, or memory loss, call your care team right away. What side effects may I notice from receiving this medication? Side effects that you should report to your care team as soon as possible: Allergic reactions--skin rash, itching, hives, swelling of the face, lips, tongue, or throat Aplastic anemia--unusual weakness or fatigue, dizziness, headache, trouble breathing, increased bleeding or bruising Change in vision Heart rhythm changes--fast or irregular heartbeat, dizziness, feeling faint or lightheaded, chest pain, trouble breathing Infection--fever, chills, cough, or sore throat Low blood sugar (hypoglycemia)--tremors or shaking, anxiety, sweating, cold or clammy skin, confusion, dizziness, rapid heartbeat Muscle injury--unusual weakness or fatigue, muscle pain, dark yellow or brown urine, decrease in amount of urine Pain, tingling, or numbness in the hands or feet Rash, fever, and swollen lymph nodes Redness, blistering, peeling, or loosening of the skin, including inside the mouth Thoughts of suicide or self-harm, worsening mood, or feelings of depression Unusual bruising or bleeding Side effects that usually do not require medical attention (report to your care team if they continue or are bothersome): Diarrhea Headache Nausea Stomach pain Vomiting This list may not describe all possible side effects. Call your doctor for medical advice about side effects. You may report side effects to FDA at 1-800-FDA-1088. Where should I keep my medication? Keep out of the reach of children and pets. Store at room temperature up to 30 degrees C (86 degrees F). Protect from light. Get rid of any unused medication after the expiration date. To get rid of medications that are no longer needed or have expired: Take the medication to a medication take-back program. Check with your pharmacy or law enforcement  to find a location. If you cannot return the medication, check the label or package insert to see if the medication should be thrown out in the garbage or flushed down the toilet. If you are not sure, ask your care team. If it is safe to put it in the trash, empty the medication out of the container. Mix the medication with cat litter, dirt, coffee grounds, or other unwanted substance. Seal the mixture in a bag or container. Put it in the trash. NOTE: This sheet is a summary. It may not cover all possible information. If you have questions about this medicine, talk to your doctor, pharmacist, or health care provider.  2023 Elsevier/Gold Standard (2008-02-01 00:00:00)

## 2023-02-16 NOTE — Progress Notes (Signed)
Lab recheck shows partial improvement in the liver enzyme tests down to 42 from 79 and to 84 from 151. I am not worried about these in general but we will have to try alternative option to methotrexate.   My first choice would be to try hydroxychloroquine which is a different oral medication that is taken daily. Otherwise would have to look at injectable medication options. We can send some information to review about this if he is interested or schedule a visit or telemedicine visit to discuss options in more detail.

## 2023-02-19 ENCOUNTER — Telehealth: Payer: Self-pay | Admitting: Internal Medicine

## 2023-02-19 ENCOUNTER — Other Ambulatory Visit (HOSPITAL_BASED_OUTPATIENT_CLINIC_OR_DEPARTMENT_OTHER): Payer: Self-pay

## 2023-02-19 ENCOUNTER — Other Ambulatory Visit: Payer: Self-pay | Admitting: Physician Assistant

## 2023-02-19 MED ORDER — ALLOPURINOL 300 MG PO TABS
300.0000 mg | ORAL_TABLET | Freq: Every day | ORAL | 0 refills | Status: DC
  Filled 2023-02-19: qty 90, 90d supply, fill #0

## 2023-02-19 NOTE — Telephone Encounter (Signed)
Attempted to contact patient and left message to advise patient to call the office and schedule telehealth appointment.

## 2023-02-19 NOTE — Telephone Encounter (Signed)
-----   Message from Shona Needles, RT sent at 02/16/2023  8:43 AM EST ----- Regarding: NEW START PLQ Please call patient to schedule a televisit, New start PLQ. Thank you.

## 2023-03-05 NOTE — Progress Notes (Unsigned)
Office Visit Note  Patient: Elijah King             Date of Birth: [birth year]           MRN: [id number]             PCP: [name], PA Referring: [name], PA Visit Date: 03/06/2023   Subjective:  No chief complaint on file.   History of Present Illness: GENEROSO BATTENFIELD is a 52 y.o. male here for follow up ***   Previous HPI 01/29/23 KITAI DEVINCENT is a 52 y.o. male here for follow up for seronegative rheumatoid arthritis and gout on methotrexate 20 mg p.o. weekly and allopurinol [id number] mg daily and folic acid 1 mg daily.  Overall symptoms have been doing pretty well still gets a little bit of pain and stiffness in his knee most commonly after increased activity such as playing sports and running with his 51 year old.  Foot and ankle have not been a significant problem.  He had 1 episode of a very painful sore throat with some kind of illness this was treated with a course of Magic mouthwash and prednisone and Augmentin and has completely resolved.     Previous HPI 08/29/22 CASETON HALABY is a 52 y.o. male here for follow up for seronegative rheumatoid arthritis and gout on methotrexate 20 mg p.o. weekly and allopurinol [id number] mg daily and folic acid 1 mg daily.  Overall symptoms are doing very well on his current regimen.  He is experiencing intermittent increases at foot and ankle pain but these are never as severe as the previous gout flares.  He does continue to have pain and stiffness in his left knee gets worse anytime he is stationary seated for more than a few minutes at a time loosens up once he is walking.   Previous HPI 04/24/2022 JONES STRIMPLE is a 52 y.o. male here for follow up for seronegative arthritis on MTX 15 mg PO weekly and gout on allopurinol 300 mg daily. Since our last visit he has no major flare up of arthritis symptoms. He does notice daily morning stiffness and also joint stiffness worse again after he sits in a fixed position for very long. This improves once  up and moving. Knees are the worst symptoms particularly on left.   Previous HPI 04/19/21 KALYAN SCAVETTA is a 52 y.o. male with a history of polyarticular gout since decades ago on allopurinol 600 mg per day here with acute flare of pain since 2 days ago. Symptoms started at left lateral ankle and foot with subsequent expansion to involve left foot toes and whole ankle. Now also feeling right knee pain but much less than the left side symptoms. His last uric acid level was 3.2 in February and he denies missing or changing his medication since then. He is not currently on any colchicine.   No Rheumatology ROS completed.   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   Genetic testing 04/06/2021   Family history of breast cancer 03/31/2021   Family history of ovarian cancer 03/31/2021   Family history of genetic disease carrier    Elbow swelling, left 03/03/2021   Pes anserine bursitis 07/12/2020   Elevated blood pressure reading 03/28/2017   Snoring 03/28/2017   Solitary kidney, acquired 03/28/2017   Chronic kidney disease 03/28/2017   Former smoker 03/28/2017   Obesity (  BMI 30-39.9) 03/28/2017   Gastroesophageal reflux disease 03/28/2017   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    Past Medical History:  Diagnosis Date   Chronic kidney disease    only 1 kidney   Family history of genetic disease carrier    GERD (gastroesophageal reflux disease)    Gout    RA (rheumatoid arthritis) ([place])     Family History  Problem Relation Age of Onset   Anuerysm Father    Lung cancer Father    Breast cancer Maternal Aunt    Ovarian cancer Maternal Aunt    Breast cancer Other        MGMs mother dx in her 42s   Colon cancer Neg Hx    Prostate cancer Neg Hx    Colon polyps Neg Hx    Esophageal cancer Neg Hx    Rectal cancer Neg Hx    Stomach cancer Neg Hx    Past Surgical History:  Procedure  Laterality Date   KIDNEY CYST REMOVAL Left 1983   NEPHRECTOMY Left 1983   Social History   Social History Narrative   Has a desk job   Live in [place]   Married, 1 child (son)   Immunization History  Administered Date(s) Administered   Influenza Split 01/02/2013   Influenza,inj,Quad PF,6+ Mos 09/13/2018   Influenza-Unspecified 09/24/2019   Tdap 03/28/2017     Objective: Vital Signs: There were no vitals taken for this visit.   Physical Exam   Musculoskeletal Exam: ***  CDAI Exam: CDAI Score: -- Patient Global: --; Provider Global: -- Swollen: --; Tender: -- Joint Exam 03/06/2023   No joint exam has been documented for this visit   There is currently no information documented on the homunculus. Go to the Rheumatology activity and complete the homunculus joint exam.  Investigation: No additional findings.  Imaging: No results found.  Recent Labs: Lab Results  Component Value Date   WBC 6.4 02/15/2023   HGB 14.1 02/15/2023   PLT 186 02/15/2023   NA 140 02/15/2023   K 4.4 02/15/2023   CL 106 02/15/2023   CO2 26 02/15/2023   GLUCOSE 122 (H) 02/15/2023   BUN 23 02/15/2023   CREATININE 1.23 02/15/2023   BILITOT 0.3 02/15/2023   ALKPHOS 82 04/04/2021   AST 42 (H) 02/15/2023   ALT 84 (H) 02/15/2023   PROT 6.7 02/15/2023   ALBUMIN 4.5 04/04/2021   CALCIUM 9.2 02/15/2023   GFRAA 81 05/13/2016   QFTBGOLDPLUS NEGATIVE 04/27/2021    Speciality Comments: No specialty comments available.  Procedures:  No procedures performed Allergies: Patient has no known allergies.   Assessment / Plan:     Visit Diagnoses: No diagnosis found.  ***  Orders: No orders of the defined types were placed in this encounter.  No orders of the defined types were placed in this encounter.    Follow-Up Instructions: No follow-ups on file.   Collier Salina, MD  Note - This record has been created using [employer].  Chart creation errors have been sought, but may  not always  have been located. Such creation errors do not reflect on  the standard of medical care.

## 2023-03-06 ENCOUNTER — Other Ambulatory Visit (HOSPITAL_BASED_OUTPATIENT_CLINIC_OR_DEPARTMENT_OTHER): Payer: Self-pay

## 2023-03-06 ENCOUNTER — Ambulatory Visit: Payer: 59 | Attending: Internal Medicine | Admitting: Internal Medicine

## 2023-03-06 ENCOUNTER — Encounter: Payer: Self-pay | Admitting: Internal Medicine

## 2023-03-06 VITALS — BP 135/89 | HR 84 | Resp 12 | Ht 72.0 in | Wt 225.0 lb

## 2023-03-06 DIAGNOSIS — M1380 Other specified arthritis, unspecified site: Secondary | ICD-10-CM

## 2023-03-06 DIAGNOSIS — M109 Gout, unspecified: Secondary | ICD-10-CM | POA: Diagnosis not present

## 2023-03-06 MED ORDER — HYDROXYCHLOROQUINE SULFATE 200 MG PO TABS
400.0000 mg | ORAL_TABLET | Freq: Every day | ORAL | 2 refills | Status: AC
  Filled 2023-03-06: qty 50, 25d supply, fill #0
  Filled 2023-03-06: qty 10, 5d supply, fill #0
  Filled 2023-07-30: qty 60, 30d supply, fill #1
  Filled 2024-01-23: qty 60, 30d supply, fill #2

## 2023-04-30 ENCOUNTER — Ambulatory Visit: Payer: 59 | Admitting: Internal Medicine

## 2023-05-22 ENCOUNTER — Other Ambulatory Visit: Payer: Self-pay | Admitting: Physician Assistant

## 2023-05-22 ENCOUNTER — Other Ambulatory Visit (HOSPITAL_BASED_OUTPATIENT_CLINIC_OR_DEPARTMENT_OTHER): Payer: Self-pay

## 2023-05-22 MED ORDER — ALLOPURINOL 300 MG PO TABS
300.0000 mg | ORAL_TABLET | Freq: Every day | ORAL | 0 refills | Status: DC
  Filled 2023-05-22 – 2023-07-30 (×2): qty 90, 90d supply, fill #0

## 2023-05-30 ENCOUNTER — Ambulatory Visit: Payer: 59 | Admitting: Internal Medicine

## 2023-05-30 NOTE — Progress Notes (Deleted)
Office Visit Note  Patient: Elijah King             Date of Birth: 03-Nov-1971           MRN: [id number]             PCP: Jarold Motto, PA Referring: Jarold Motto, PA Visit Date: 05/30/2023   Subjective:  No chief complaint on file.   History of Present Illness: OWIN HURNEY is a 52 y.o. male here for follow up ***   Previous HPI 03/06/23 BURWELL HOAGE is a 52 y.o. male here for follow up for seronegative rheumatoid arthritis and polyarticular gout on allopurinol 300 mg daily.  After last visit we discontinued the methotrexate due to persistent abnormal liver function tests despite dose reduction.  He is now off the medicine for about 4 weeks no significant increase or flareup of symptoms so far.  We discussed alternate treatment option with hydroxychloroquine but he had concern about medication plan and potential side effects so following up to review this today.   Previous HPI 01/29/23 KAHIAU WISON is a 52 y.o. male here for follow up for seronegative rheumatoid arthritis and gout on methotrexate 20 mg p.o. weekly and allopurinol 300 mg daily and folic acid 1 mg daily.  Overall symptoms have been doing pretty well still gets a little bit of pain and stiffness in his knee most commonly after increased activity such as playing sports and running with his 51 year old.  Foot and ankle have not been a significant problem.  He had 1 episode of a very painful sore throat with some kind of illness this was treated with a course of Magic mouthwash and prednisone and Augmentin and has completely resolved.   Previous HPI 08/29/22 VERMONT REBECK is a 52 y.o. male here for follow up for seronegative rheumatoid arthritis and gout on methotrexate 20 mg p.o. weekly and allopurinol 300 mg daily and folic acid 1 mg daily.  Overall symptoms are doing very well on his current regimen.  He is experiencing intermittent increases at foot and ankle pain but these are never as severe as the previous gout  flares.  He does continue to have pain and stiffness in his left knee gets worse anytime he is stationary seated for more than a few minutes at a time loosens up once he is walking.   Previous HPI 04/24/2022 ANGELIS TOMICH is a 52 y.o. male here for follow up for seronegative arthritis on MTX 15 mg PO weekly and gout on allopurinol 300 mg daily. Since our last visit he has no major flare up of arthritis symptoms. He does notice daily morning stiffness and also joint stiffness worse again after he sits in a fixed position for very long. This improves once up and moving. Knees are the worst symptoms particularly on left.   Previous HPI 04/19/21 KOHLMAN BIANCA is a 52 y.o. male with a history of polyarticular gout since decades ago on allopurinol 600 mg per day here with acute flare of pain since 2 days ago. Symptoms started at left lateral ankle and foot with subsequent expansion to involve left foot toes and whole ankle. Now also feeling right knee pain but much less than the left side symptoms. His last uric acid level was 3.2 in February and he denies missing or changing his medication since then. He is not currently on any colchicine.   No Rheumatology ROS completed.   PMFS History:  Patient Active Problem  List   Diagnosis Date Noted   High risk medication use 04/27/2021   Seronegative arthritis 04/27/2021   Genetic testing 04/06/2021   Family history of breast cancer 03/31/2021   Family history of ovarian cancer 03/31/2021   Family history of genetic disease carrier    Elbow swelling, left 03/03/2021   Pes anserine bursitis 07/12/2020   Elevated blood pressure reading 03/28/2017   Snoring 03/28/2017   Solitary kidney, acquired 03/28/2017   Chronic kidney disease 03/28/2017   Former smoker 03/28/2017   Obesity (BMI 30-39.9) 03/28/2017   Gastroesophageal reflux disease 03/28/2017   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    Past Medical History:  Diagnosis Date   Chronic kidney disease    only  1 kidney   Family history of genetic disease carrier    GERD (gastroesophageal reflux disease)    Gout    RA (rheumatoid arthritis) (HCC)     Family History  Problem Relation Age of Onset   Anuerysm Father    Lung cancer Father    Breast cancer Maternal Aunt    Ovarian cancer Maternal Aunt    Breast cancer Other        MGMs mother dx in her 41s   Colon cancer Neg Hx    Prostate cancer Neg Hx    Colon polyps Neg Hx    Esophageal cancer Neg Hx    Rectal cancer Neg Hx    Stomach cancer Neg Hx    Past Surgical History:  Procedure Laterality Date   KIDNEY CYST REMOVAL Left 1983   NEPHRECTOMY Left 1983   Social History   Social History Narrative   Has a desk job   Live in [place]   Married, 1 child (son)   Immunization History  Administered Date(s) Administered   Influenza Split 01/02/2013   Influenza,inj,Quad PF,6+ Mos 09/13/2018   Influenza-Unspecified 09/24/2019   Tdap 03/28/2017     Objective: Vital Signs: There were no vitals taken for this visit.   Physical Exam   Musculoskeletal Exam: ***  CDAI Exam: CDAI Score: -- Patient Global: --; Provider Global: -- Swollen: --; Tender: -- Joint Exam 05/30/2023   No joint exam has been documented for this visit   There is currently no information documented on the homunculus. Go to the Rheumatology activity and complete the homunculus joint exam.  Investigation: No additional findings.  Imaging: No results found.  Recent Labs: Lab Results  Component Value Date   WBC 6.4 02/15/2023   HGB 14.1 02/15/2023   PLT 186 02/15/2023   NA 140 02/15/2023   K 4.4 02/15/2023   CL 106 02/15/2023   CO2 26 02/15/2023   GLUCOSE 122 (H) 02/15/2023   BUN 23 02/15/2023   CREATININE 1.23 02/15/2023   BILITOT 0.3 02/15/2023   ALKPHOS 82 04/04/2021   AST 42 (H) 02/15/2023   ALT 84 (H) 02/15/2023   PROT 6.7 02/15/2023   ALBUMIN 4.5 04/04/2021   CALCIUM 9.2 02/15/2023   GFRAA 81 05/13/2016   QFTBGOLDPLUS NEGATIVE  04/27/2021    Speciality Comments: No specialty comments available.  Procedures:  No procedures performed Allergies: Patient has no known allergies.   Assessment / Plan:     Visit Diagnoses: No diagnosis found.  ***  Orders: No orders of the defined types were placed in this encounter.  No orders of the defined types were placed in this encounter.    Follow-Up Instructions: No follow-ups on file.   Fuller Plan, MD  Note - This record has  been created using [employer].  Chart creation errors have been sought, but may not always  have been located. Such creation errors do not reflect on  the standard of medical care.

## 2023-06-01 ENCOUNTER — Other Ambulatory Visit (HOSPITAL_BASED_OUTPATIENT_CLINIC_OR_DEPARTMENT_OTHER): Payer: Self-pay

## 2023-07-30 ENCOUNTER — Other Ambulatory Visit (HOSPITAL_BASED_OUTPATIENT_CLINIC_OR_DEPARTMENT_OTHER): Payer: Self-pay

## 2023-08-14 ENCOUNTER — Other Ambulatory Visit (HOSPITAL_COMMUNITY): Payer: Self-pay

## 2023-08-15 ENCOUNTER — Other Ambulatory Visit (HOSPITAL_COMMUNITY): Payer: Self-pay

## 2024-01-23 ENCOUNTER — Other Ambulatory Visit: Payer: Self-pay | Admitting: Physician Assistant

## 2024-01-23 ENCOUNTER — Other Ambulatory Visit (HOSPITAL_BASED_OUTPATIENT_CLINIC_OR_DEPARTMENT_OTHER): Payer: Self-pay

## 2024-01-23 ENCOUNTER — Other Ambulatory Visit: Payer: Self-pay

## 2024-01-23 MED ORDER — ALLOPURINOL 300 MG PO TABS
300.0000 mg | ORAL_TABLET | Freq: Every day | ORAL | 0 refills | Status: AC
  Filled 2024-01-23: qty 90, 90d supply, fill #0

## 2024-02-04 ENCOUNTER — Other Ambulatory Visit (HOSPITAL_BASED_OUTPATIENT_CLINIC_OR_DEPARTMENT_OTHER): Payer: Self-pay

## 2024-02-16 ENCOUNTER — Other Ambulatory Visit (HOSPITAL_BASED_OUTPATIENT_CLINIC_OR_DEPARTMENT_OTHER): Payer: Self-pay

## 2024-05-12 ENCOUNTER — Ambulatory Visit: Admitting: Physician Assistant

## 2024-06-16 ENCOUNTER — Ambulatory Visit (INDEPENDENT_AMBULATORY_CARE_PROVIDER_SITE_OTHER): Admitting: Physician Assistant

## 2024-06-16 ENCOUNTER — Encounter: Payer: Self-pay | Admitting: Physician Assistant

## 2024-06-16 VITALS — BP 138/86 | HR 81 | Temp 98.4°F | Ht 72.0 in | Wt 229.0 lb

## 2024-06-16 DIAGNOSIS — F17210 Nicotine dependence, cigarettes, uncomplicated: Secondary | ICD-10-CM | POA: Diagnosis not present

## 2024-06-16 DIAGNOSIS — M109 Gout, unspecified: Secondary | ICD-10-CM | POA: Diagnosis not present

## 2024-06-16 DIAGNOSIS — M1380 Other specified arthritis, unspecified site: Secondary | ICD-10-CM

## 2024-06-16 DIAGNOSIS — Z0000 Encounter for general adult medical examination without abnormal findings: Secondary | ICD-10-CM

## 2024-06-16 DIAGNOSIS — E669 Obesity, unspecified: Secondary | ICD-10-CM

## 2024-06-16 NOTE — Progress Notes (Signed)
 Subjective:    Elijah King is a 53 y.o. male and is here for a comprehensive physical exam.  HPI  There are no preventive care reminders to display for this patient.   Acute Concerns: No acute concerns reported today.  Chronic Issues: Gout Pt is on Allopurinol  300 mg daily. Good compliance and tolerance. Gout is well-managed. He states he has not had a flare up in a long time. No acute concerns reported today.  Seronegative arthritis Pt is on Hydroxychloroquine  200 mg, 2 tablets daily. Arthritis is well-managed. No acute concerns reported today.  Health Maintenance: Immunizations -- UTD Colonoscopy -- UTD, last done 06/24/2021 and findings include 5 polyps and internal hemorrhoids. 10 year repeat recommended, next due 06/25/2031. PSA -- N/a Diet -- Has been eating and drinking a lot of protein bars and shakes. Sleep habits -- Good sleeping habits, no concerns reported. Snores. Exercise -- Goes to gym 2-3 times a week with a Systems analyst.  Weight -- Recent weight history Wt Readings from Last 10 Encounters:  06/16/24 229 lb (103.9 kg)  03/06/23 225 lb (102.1 kg)  01/29/23 225 lb (102.1 kg)  01/02/23 227 lb (103 kg)  08/29/22 225 lb (102.1 kg)  04/24/22 226 lb (102.5 kg)  01/24/22 225 lb (102.1 kg)  12/12/21 222 lb 9.6 oz (101 kg)  12/08/21 220 lb 0.3 oz (99.8 kg)  06/24/21 220 lb (99.8 kg)   Body mass index is 31.06 kg/m.  Mood -- Stable. Alcohol use --  reports current alcohol use.  Tobacco use --  Tobacco Use: Medium Risk (06/16/2024)   Patient History    Smoking Tobacco Use: Former    Smokeless Tobacco Use: Never    Passive Exposure: Current    Eligible for Low Dose CT? yes  UTD with eye doctor? UTD UTD with dentist? UTD     06/16/2024    3:02 PM  Depression screen PHQ 2/9  Decreased Interest 0  Down, Depressed, Hopeless 0  PHQ - 2 Score 0    Other providers/specialists: Patient Care Team: Job Lukes, [place] as PCP - General (Physician  Assistant)    PMHx, SurgHx, SocialHx, Medications, and Allergies were reviewed in the Visit Navigator and updated as appropriate.   Past Medical History:  Diagnosis Date   Chronic kidney disease    only 1 kidney   Family history of genetic disease carrier    GERD (gastroesophageal reflux disease)    Gout    RA (rheumatoid arthritis) (HCC)      Past Surgical History:  Procedure Laterality Date   KIDNEY CYST REMOVAL Left 1983   NEPHRECTOMY Left 1983     Family History  Problem Relation Age of Onset   Anuerysm Father    Lung cancer Father    Breast cancer Maternal Aunt    Ovarian cancer Maternal Aunt    Breast cancer Other        MGMs mother dx in her 34s   Colon cancer Neg Hx    Prostate cancer Neg Hx    Colon polyps Neg Hx    Esophageal cancer Neg Hx    Rectal cancer Neg Hx    Stomach cancer Neg Hx     Social History   Tobacco Use   Smoking status: Former    Current packs/day: 0.00    Average packs/day: 0.5 packs/day for 30.0 years (15.0 ttl pk-yrs)    Types: Cigarettes    Start date: 03/03/1989    Quit date: 12/25/2017  Years since quitting: 6.4    Passive exposure: Current   Smokeless tobacco: Never  Vaping Use   Vaping status: Some Days  Substance Use Topics   Alcohol use: Yes    Comment: occ   Drug use: No    Review of Systems:   Review of Systems  Constitutional:  Negative for chills, fever, malaise/fatigue and weight loss.  HENT:  Negative for hearing loss, sinus pain and sore throat.   Respiratory:  Negative for cough and hemoptysis.   Cardiovascular:  Negative for chest pain, palpitations, leg swelling and PND.  Gastrointestinal:  Negative for abdominal pain, constipation, diarrhea, heartburn, nausea and vomiting.  Genitourinary:  Negative for dysuria, frequency and urgency.  Musculoskeletal:  Negative for back pain, myalgias and neck pain.  Skin:  Negative for itching and rash.  Neurological:  Negative for dizziness, tingling, seizures and  headaches.  Endo/Heme/Allergies:  Negative for polydipsia.  Psychiatric/Behavioral:  Negative for depression. The patient is not nervous/anxious.       Objective:    Vitals:   06/16/24 1502  BP: 138/86  Pulse: 81  Temp: 98.4 F (36.9 C)  SpO2: 96%    Body mass index is 31.06 kg/m.  General  Alert, cooperative, no distress, appears stated age  Head:  Normocephalic, without obvious abnormality, atraumatic  Eyes:  PERRL, conjunctiva/corneas clear, EOM's intact, fundi benign, both eyes       Ears:  Normal TM's and external ear canals, both ears  Nose: Nares normal, septum midline, mucosa normal, no drainage or sinus tenderness  Throat: Lips, mucosa, and tongue normal; teeth and gums normal  Neck: Supple, symmetrical, trachea midline, no adenopathy;     thyroid:  No enlargement/tenderness/nodules; no carotid bruit or JVD  Back:   Symmetric, no curvature, ROM normal, no CVA tenderness  Lungs:   Clear to auscultation bilaterally, respirations unlabored  Chest wall:  No tenderness or deformity  Heart:  Regular rate and rhythm, S1 and S2 normal, no murmur, rub or gallop  Abdomen:   Soft, non-tender, bowel sounds active all four quadrants, no masses, no organomegaly  Extremities: Extremities normal, atraumatic, no cyanosis or edema  Prostate : Deferred  Skin: Skin color, texture, turgor normal, no rashes or lesions  Lymph nodes: Cervical, supraclavicular, and axillary nodes normal  Neurologic: CNII-XII grossly intact. Normal strength, sensation and reflexes throughout   AssessmentPlan:   Routine physical examination Today patient counseled on age appropriate routine health concerns for screening and prevention, each reviewed and up to date or declined. Immunizations reviewed and up to date or declined. Labs ordered and reviewed. Risk factors for depression reviewed and negative. Hearing function and visual acuity are intact. ADLs screened and addressed as needed. Functional ability  and level of safety reviewed and appropriate. Education, counseling and referrals performed based on assessed risks today. Patient provided with a copy of personalized plan for preventive services.  Polyarticular gout Well controlled Update uric acid and adjust allopurinol  accordingly  Obesity (BMI 30-39.9) Continue efforts at healthy diet and exercise  Smoking greater than 20 pack years Agreeable to lung cancer screening - will place today  Seronegative arthritis Well controlled Management per rheumatology   I, Lavern Simmers, acting as a Neurosurgeon for [employer], [place]., have documented all relevant documentation on the behalf of Lucie Buttner, [place], as directed by  Lucie Buttner, PA while in the presence of Lucie Buttner, [place].  I, Lavern KATHEE Simmers, have reviewed all documentation for this visit. The documentation on 06/16/24 for  the exam, diagnosis, procedures, and orders are all accurate and complete.  Lucie Buttner, PA-C [facility]

## 2024-06-17 ENCOUNTER — Ambulatory Visit: Payer: Self-pay | Admitting: Physician Assistant

## 2024-06-17 LAB — COMPREHENSIVE METABOLIC PANEL WITH GFR
ALT: 34 U/L (ref 0–53)
AST: 27 U/L (ref 0–37)
Albumin: 4.8 g/dL (ref 3.5–5.2)
Alkaline Phosphatase: 63 U/L (ref 39–117)
BUN: 26 mg/dL — ABNORMAL HIGH (ref 6–23)
CO2: 30 meq/L (ref 19–32)
Calcium: 9.7 mg/dL (ref 8.4–10.5)
Chloride: 101 meq/L (ref 96–112)
Creatinine, Ser: 1.22 mg/dL (ref 0.40–1.50)
GFR: 68.52 mL/min (ref >60.00)
Glucose, Bld: 88 mg/dL (ref 70–99)
Potassium: 4.2 meq/L (ref 3.5–5.1)
Sodium: 137 meq/L (ref 135–145)
Total Bilirubin: 0.3 mg/dL (ref 0.2–1.2)
Total Protein: 7.6 g/dL (ref 6.0–8.3)

## 2024-06-17 LAB — CBC WITH DIFFERENTIAL/PLATELET
Basophils Absolute: 0.1 10*3/uL (ref 0.0–0.1)
Basophils Relative: 0.8 % (ref 0.0–3.0)
Eosinophils Absolute: 0.3 10*3/uL (ref 0.0–0.7)
Eosinophils Relative: 4.2 % (ref 0.0–5.0)
HCT: 44 % (ref 39.0–52.0)
Hemoglobin: 14.9 g/dL (ref 13.0–17.0)
Lymphocytes Relative: 25.2 % (ref 12.0–46.0)
Lymphs Abs: 1.8 10*3/uL (ref 0.7–4.0)
MCHC: 33.8 g/dL (ref 30.0–36.0)
MCV: 91.9 fl (ref 78.0–100.0)
Monocytes Absolute: 0.5 10*3/uL (ref 0.1–1.0)
Monocytes Relative: 7.4 % (ref 3.0–12.0)
Neutro Abs: 4.4 10*3/uL (ref 1.4–7.7)
Neutrophils Relative %: 62.4 % (ref 43.0–77.0)
Platelets: 186 10*3/uL (ref 150.0–400.0)
RBC: 4.78 Mil/uL (ref 4.22–5.81)
RDW: 13.3 % (ref 11.5–15.5)
WBC: 7.1 10*3/uL (ref 4.0–10.5)

## 2024-06-17 LAB — LIPID PANEL
Cholesterol: 227 mg/dL — ABNORMAL HIGH (ref 0–200)
HDL: 53.9 mg/dL (ref >39.00)
LDL Cholesterol: 124 mg/dL — ABNORMAL HIGH (ref 0–99)
NonHDL: 173.36
Total CHOL/HDL Ratio: 4
Triglycerides: 245 mg/dL — ABNORMAL HIGH (ref 0.0–149.0)
VLDL: 49 mg/dL — ABNORMAL HIGH (ref 0.0–40.0)

## 2024-06-17 LAB — URIC ACID: Uric Acid, Serum: 8 mg/dL — ABNORMAL HIGH (ref 4.0–7.8)

## 2024-11-12 ENCOUNTER — Ambulatory Visit: Payer: Self-pay
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
## 2025-06-17 ENCOUNTER — Encounter: Admitting: Physician Assistant

## 2025-06-22 ENCOUNTER — Encounter: Admitting: Physician Assistant
